# Patient Record
Sex: Male | Born: 1935 | Race: White | Hispanic: No | Smoking: Never smoker
Health system: Southern US, Community
[De-identification: ages and names within clinical notes are randomized; demographics above are authoritative.]

## PROBLEM LIST (undated history)

## (undated) DIAGNOSIS — I4891 Unspecified atrial fibrillation: Secondary | ICD-10-CM

## (undated) DIAGNOSIS — M4856XA Collapsed vertebra, not elsewhere classified, lumbar region, initial encounter for fracture: Secondary | ICD-10-CM

## (undated) DIAGNOSIS — K259 Gastric ulcer, unspecified as acute or chronic, without hemorrhage or perforation: Secondary | ICD-10-CM

## (undated) DIAGNOSIS — C801 Malignant (primary) neoplasm, unspecified: Secondary | ICD-10-CM

## (undated) HISTORY — PX: TONSILLECTOMY: SUR1361

## (undated) HISTORY — PX: HEMORRHOID SURGERY: SHX153

---

## 2020-11-29 ENCOUNTER — Emergency Department: Payer: Medicare Other

## 2020-11-29 ENCOUNTER — Other Ambulatory Visit: Payer: Self-pay

## 2020-11-29 ENCOUNTER — Inpatient Hospital Stay
Admission: EM | Admit: 2020-11-29 | Discharge: 2020-12-25 | DRG: 951 | Disposition: E | Payer: Medicare Other | Attending: Internal Medicine | Admitting: Internal Medicine

## 2020-11-29 ENCOUNTER — Encounter: Payer: Self-pay | Admitting: Emergency Medicine

## 2020-11-29 DIAGNOSIS — C3492 Malignant neoplasm of unspecified part of left bronchus or lung: Secondary | ICD-10-CM

## 2020-11-29 DIAGNOSIS — R112 Nausea with vomiting, unspecified: Secondary | ICD-10-CM | POA: Diagnosis not present

## 2020-11-29 DIAGNOSIS — A419 Sepsis, unspecified organism: Secondary | ICD-10-CM

## 2020-11-29 DIAGNOSIS — Z515 Encounter for palliative care: Principal | ICD-10-CM

## 2020-11-29 DIAGNOSIS — E872 Acidosis: Secondary | ICD-10-CM | POA: Diagnosis present

## 2020-11-29 DIAGNOSIS — C799 Secondary malignant neoplasm of unspecified site: Secondary | ICD-10-CM | POA: Diagnosis not present

## 2020-11-29 DIAGNOSIS — Z66 Do not resuscitate: Secondary | ICD-10-CM | POA: Diagnosis present

## 2020-11-29 DIAGNOSIS — R627 Adult failure to thrive: Secondary | ICD-10-CM | POA: Diagnosis not present

## 2020-11-29 DIAGNOSIS — Z8711 Personal history of peptic ulcer disease: Secondary | ICD-10-CM

## 2020-11-29 DIAGNOSIS — J189 Pneumonia, unspecified organism: Secondary | ICD-10-CM

## 2020-11-29 DIAGNOSIS — R54 Age-related physical debility: Secondary | ICD-10-CM | POA: Diagnosis present

## 2020-11-29 DIAGNOSIS — Z8546 Personal history of malignant neoplasm of prostate: Secondary | ICD-10-CM

## 2020-11-29 DIAGNOSIS — Z7952 Long term (current) use of systemic steroids: Secondary | ICD-10-CM

## 2020-11-29 DIAGNOSIS — C61 Malignant neoplasm of prostate: Secondary | ICD-10-CM | POA: Diagnosis present

## 2020-11-29 DIAGNOSIS — I4891 Unspecified atrial fibrillation: Secondary | ICD-10-CM | POA: Diagnosis present

## 2020-11-29 DIAGNOSIS — N400 Enlarged prostate without lower urinary tract symptoms: Secondary | ICD-10-CM | POA: Diagnosis present

## 2020-11-29 DIAGNOSIS — R778 Other specified abnormalities of plasma proteins: Secondary | ICD-10-CM | POA: Diagnosis present

## 2020-11-29 DIAGNOSIS — A4102 Sepsis due to Methicillin resistant Staphylococcus aureus: Secondary | ICD-10-CM | POA: Diagnosis present

## 2020-11-29 DIAGNOSIS — C3412 Malignant neoplasm of upper lobe, left bronchus or lung: Secondary | ICD-10-CM | POA: Diagnosis present

## 2020-11-29 DIAGNOSIS — Z880 Allergy status to penicillin: Secondary | ICD-10-CM

## 2020-11-29 DIAGNOSIS — Z79899 Other long term (current) drug therapy: Secondary | ICD-10-CM

## 2020-11-29 DIAGNOSIS — I10 Essential (primary) hypertension: Secondary | ICD-10-CM | POA: Diagnosis present

## 2020-11-29 DIAGNOSIS — M84512A Pathological fracture in neoplastic disease, left shoulder, initial encounter for fracture: Secondary | ICD-10-CM | POA: Diagnosis present

## 2020-11-29 DIAGNOSIS — L89616 Pressure-induced deep tissue damage of right heel: Secondary | ICD-10-CM | POA: Diagnosis present

## 2020-11-29 DIAGNOSIS — L8990 Pressure ulcer of unspecified site, unspecified stage: Secondary | ICD-10-CM | POA: Insufficient documentation

## 2020-11-29 DIAGNOSIS — C779 Secondary and unspecified malignant neoplasm of lymph node, unspecified: Secondary | ICD-10-CM | POA: Diagnosis present

## 2020-11-29 DIAGNOSIS — Z791 Long term (current) use of non-steroidal anti-inflammatories (NSAID): Secondary | ICD-10-CM

## 2020-11-29 DIAGNOSIS — Z20822 Contact with and (suspected) exposure to covid-19: Secondary | ICD-10-CM | POA: Diagnosis present

## 2020-11-29 HISTORY — DX: Gastric ulcer, unspecified as acute or chronic, without hemorrhage or perforation: K25.9

## 2020-11-29 HISTORY — DX: Collapsed vertebra, not elsewhere classified, lumbar region, initial encounter for fracture: M48.56XA

## 2020-11-29 HISTORY — DX: Malignant (primary) neoplasm, unspecified: C80.1

## 2020-11-29 HISTORY — DX: Unspecified atrial fibrillation: I48.91

## 2020-11-29 LAB — BASIC METABOLIC PANEL
Anion gap: 13 (ref 5–15)
BUN: 25 mg/dL — ABNORMAL HIGH (ref 8–23)
CO2: 23 mmol/L (ref 22–32)
Calcium: 8.6 mg/dL — ABNORMAL LOW (ref 8.9–10.3)
Chloride: 101 mmol/L (ref 98–111)
Creatinine, Ser: 0.84 mg/dL (ref 0.61–1.24)
GFR, Estimated: >60 mL/min (ref >60)
Glucose, Bld: 127 mg/dL — ABNORMAL HIGH (ref 70–99)
Potassium: 3.5 mmol/L (ref 3.5–5.1)
Sodium: 137 mmol/L (ref 135–145)

## 2020-11-29 LAB — URINALYSIS, COMPLETE (UACMP) WITH MICROSCOPIC
Bacteria, UA: NONE SEEN
Bilirubin Urine: NEGATIVE
Glucose, UA: NEGATIVE mg/dL
Hgb urine dipstick: NEGATIVE
Ketones, ur: NEGATIVE mg/dL
Leukocytes,Ua: NEGATIVE
Nitrite: NEGATIVE
Protein, ur: 30 mg/dL — AB
Specific Gravity, Urine: 1.035 — ABNORMAL HIGH (ref 1.005–1.030)
Squamous Epithelial / HPF: NONE SEEN (ref 0–5)
pH: 5 (ref 5.0–8.0)

## 2020-11-29 LAB — PROTIME-INR
INR: 1.1 (ref 0.8–1.2)
Prothrombin Time: 14 seconds (ref 11.4–15.2)

## 2020-11-29 LAB — CBC
HCT: 41.3 % (ref 39.0–52.0)
Hemoglobin: 14 g/dL (ref 13.0–17.0)
MCH: 31.7 pg (ref 26.0–34.0)
MCHC: 33.9 g/dL (ref 30.0–36.0)
MCV: 93.4 fL (ref 80.0–100.0)
Platelets: 177 10*3/uL (ref 150–400)
RBC: 4.42 MIL/uL (ref 4.22–5.81)
RDW: 14.4 % (ref 11.5–15.5)
WBC: 11.9 10*3/uL — ABNORMAL HIGH (ref 4.0–10.5)
nRBC: 0 % (ref 0.0–0.2)

## 2020-11-29 LAB — HEPATIC FUNCTION PANEL
ALT: 18 U/L (ref 0–44)
AST: 26 U/L (ref 15–41)
Albumin: 3.6 g/dL (ref 3.5–5.0)
Alkaline Phosphatase: 147 U/L — ABNORMAL HIGH (ref 38–126)
Bilirubin, Direct: 0.4 mg/dL — ABNORMAL HIGH (ref 0.0–0.2)
Indirect Bilirubin: 1.3 mg/dL — ABNORMAL HIGH (ref 0.3–0.9)
Total Bilirubin: 1.7 mg/dL — ABNORMAL HIGH (ref 0.3–1.2)
Total Protein: 5.9 g/dL — ABNORMAL LOW (ref 6.5–8.1)

## 2020-11-29 LAB — LACTIC ACID, PLASMA: Lactic Acid, Venous: 2.3 mmol/L (ref 0.5–1.9)

## 2020-11-29 LAB — RESP PANEL BY RT-PCR (FLU A&B, COVID) ARPGX2
Influenza A by PCR: NEGATIVE
Influenza B by PCR: NEGATIVE
SARS Coronavirus 2 by RT PCR: NEGATIVE

## 2020-11-29 LAB — TROPONIN I (HIGH SENSITIVITY)
Troponin I (High Sensitivity): 98 ng/L — ABNORMAL HIGH (ref <18)
Troponin I (High Sensitivity): 83 ng/L — ABNORMAL HIGH (ref <18)

## 2020-11-29 LAB — APTT: aPTT: 35 seconds (ref 24–36)

## 2020-11-29 MED ORDER — SODIUM CHLORIDE 0.9 % IV SOLN: 1.0000 g | Freq: Once | INTRAVENOUS | Status: DC

## 2020-11-29 MED ORDER — ONDANSETRON HCL 4 MG/2ML IJ SOLN: 4.0000 mg | Freq: Four times a day (QID) | INTRAMUSCULAR | Status: DC | PRN

## 2020-11-29 MED ORDER — LACTATED RINGERS IV SOLN: INTRAVENOUS | Status: DC

## 2020-11-29 MED ORDER — SODIUM CHLORIDE 0.9 % IV BOLUS
500.0000 mL | Freq: Once | INTRAVENOUS | Status: AC
  Administered 2020-11-29: 500 mL via INTRAVENOUS

## 2020-11-29 MED ORDER — DIPHENHYDRAMINE HCL 50 MG/ML IJ SOLN: 12.5000 mg | INTRAMUSCULAR | Status: DC | PRN

## 2020-11-29 MED ORDER — ACETAMINOPHEN 325 MG PO TABS
650.0000 mg | ORAL_TABLET | Freq: Four times a day (QID) | ORAL | Status: DC | PRN
  Administered 2020-11-30: 10:00:00 650 mg via ORAL
  Filled 2020-11-29: qty 2

## 2020-11-29 MED ORDER — ONDANSETRON 4 MG PO TBDP
4.0000 mg | ORAL_TABLET | Freq: Four times a day (QID) | ORAL | Status: DC | PRN
  Filled 2020-11-29: qty 1

## 2020-11-29 MED ORDER — HALOPERIDOL LACTATE 5 MG/ML IJ SOLN: 0.5000 mg | INTRAMUSCULAR | Status: DC | PRN

## 2020-11-29 MED ORDER — ACETAMINOPHEN 650 MG RE SUPP: 650.0000 mg | Freq: Four times a day (QID) | RECTAL | Status: DC | PRN

## 2020-11-29 MED ORDER — LOPERAMIDE HCL 2 MG PO CAPS: 2.0000 mg | ORAL_CAPSULE | ORAL | Status: DC | PRN

## 2020-11-29 MED ORDER — MORPHINE SULFATE (CONCENTRATE) 10 MG/0.5ML PO SOLN
5.0000 mg | ORAL | Status: DC | PRN
  Administered 2020-12-01: 11:00:00 5 mg via ORAL
  Filled 2020-11-29: qty 0.5

## 2020-11-29 MED ORDER — HALOPERIDOL 0.5 MG PO TABS
0.5000 mg | ORAL_TABLET | ORAL | Status: DC | PRN
  Filled 2020-11-29: qty 1

## 2020-11-29 MED ORDER — LORAZEPAM 2 MG/ML IJ SOLN
1.0000 mg | INTRAMUSCULAR | Status: DC | PRN
  Administered 2020-12-02 – 2020-12-03 (×5): 1 mg via INTRAVENOUS
  Filled 2020-11-29 (×4): qty 1

## 2020-11-29 MED ORDER — ONDANSETRON HCL 4 MG PO TABS: 4.0000 mg | ORAL_TABLET | Freq: Four times a day (QID) | ORAL | Status: DC | PRN

## 2020-11-29 MED ORDER — ONDANSETRON HCL 4 MG/2ML IJ SOLN
4.0000 mg | Freq: Once | INTRAMUSCULAR | Status: AC
  Administered 2020-11-29: 4 mg via INTRAVENOUS
  Filled 2020-11-29: qty 2

## 2020-11-29 MED ORDER — GLYCOPYRROLATE 0.2 MG/ML IJ SOLN
0.2000 mg | INTRAMUSCULAR | Status: DC | PRN
  Administered 2020-12-02 – 2020-12-03 (×3): 0.2 mg via INTRAVENOUS
  Filled 2020-11-29 (×5): qty 1

## 2020-11-29 MED ORDER — LORAZEPAM 1 MG PO TABS: 1.0000 mg | ORAL_TABLET | ORAL | Status: DC | PRN

## 2020-11-29 MED ORDER — POLYVINYL ALCOHOL 1.4 % OP SOLN
1.0000 [drp] | Freq: Four times a day (QID) | OPHTHALMIC | Status: DC | PRN
  Filled 2020-11-29: qty 15

## 2020-11-29 MED ORDER — METRONIDAZOLE IN NACL 5-0.79 MG/ML-% IV SOLN
500.0000 mg | Freq: Once | INTRAVENOUS | Status: AC
  Administered 2020-11-29: 500 mg via INTRAVENOUS
  Filled 2020-11-29: qty 100

## 2020-11-29 MED ORDER — GLYCOPYRROLATE 0.2 MG/ML IJ SOLN
0.2000 mg | INTRAMUSCULAR | Status: DC | PRN
  Administered 2020-12-02: 13:00:00 0.2 mg via SUBCUTANEOUS
  Filled 2020-11-29: qty 1

## 2020-11-29 MED ORDER — MELOXICAM 7.5 MG PO TABS
15.0000 mg | ORAL_TABLET | Freq: Every day | ORAL | Status: DC
  Administered 2020-11-30 – 2020-12-01 (×2): 15 mg via ORAL
  Filled 2020-11-29 (×5): qty 2

## 2020-11-29 MED ORDER — LORAZEPAM 2 MG/ML PO CONC: 1.0000 mg | ORAL | Status: DC | PRN

## 2020-11-29 MED ORDER — SENNA 8.6 MG PO TABS: 1.0000 | ORAL_TABLET | Freq: Every evening | ORAL | Status: DC | PRN

## 2020-11-29 MED ORDER — SODIUM CHLORIDE 0.9 % IV SOLN
2.0000 g | Freq: Once | INTRAVENOUS | Status: AC
  Administered 2020-11-29: 2 g via INTRAVENOUS
  Filled 2020-11-29: qty 2

## 2020-11-29 MED ORDER — VANCOMYCIN HCL IN DEXTROSE 1-5 GM/200ML-% IV SOLN
1000.0000 mg | Freq: Once | INTRAVENOUS | Status: AC
  Administered 2020-11-29: 1000 mg via INTRAVENOUS
  Filled 2020-11-29: qty 200

## 2020-11-29 MED ORDER — TRAZODONE HCL 50 MG PO TABS: 25.0000 mg | ORAL_TABLET | Freq: Every evening | ORAL | Status: DC | PRN

## 2020-11-29 MED ORDER — MORPHINE SULFATE (CONCENTRATE) 10 MG/0.5ML PO SOLN
5.0000 mg | ORAL | Status: DC | PRN
  Administered 2020-12-02: 5 mg via SUBLINGUAL
  Filled 2020-11-29: qty 0.5

## 2020-11-29 MED ORDER — LORAZEPAM 2 MG/ML IJ SOLN
1.0000 mg | INTRAMUSCULAR | Status: DC | PRN
  Administered 2020-12-02: 08:00:00 1 mg via INTRAVENOUS
  Filled 2020-11-29 (×2): qty 1

## 2020-11-29 MED ORDER — MORPHINE SULFATE (PF) 2 MG/ML IV SOLN
1.0000 mg | INTRAVENOUS | Status: DC | PRN
  Administered 2020-11-30 – 2020-12-03 (×10): 1 mg via INTRAVENOUS
  Filled 2020-11-29 (×10): qty 1

## 2020-11-29 MED ORDER — ACETAMINOPHEN 325 MG PO TABS: 650.0000 mg | ORAL_TABLET | Freq: Four times a day (QID) | ORAL | Status: DC | PRN

## 2020-11-29 MED ORDER — ONDANSETRON HCL 4 MG/2ML IJ SOLN
4.0000 mg | Freq: Four times a day (QID) | INTRAMUSCULAR | Status: DC | PRN
  Administered 2020-11-29 – 2020-12-03 (×6): 4 mg via INTRAVENOUS
  Filled 2020-11-29 (×6): qty 2

## 2020-11-29 MED ORDER — HALOPERIDOL LACTATE 2 MG/ML PO CONC
0.5000 mg | ORAL | Status: DC | PRN
  Filled 2020-11-29: qty 0.3

## 2020-11-29 MED ORDER — IOHEXOL 300 MG/ML  SOLN
100.0000 mL | Freq: Once | INTRAMUSCULAR | Status: AC | PRN
  Administered 2020-11-29: 100 mL via INTRAVENOUS

## 2020-11-29 MED ORDER — GLYCOPYRROLATE 1 MG PO TABS
1.0000 mg | ORAL_TABLET | ORAL | Status: DC | PRN
  Filled 2020-11-29: qty 1

## 2020-11-29 MED ORDER — BIOTENE DRY MOUTH MT LIQD
15.0000 mL | OROMUCOSAL | Status: DC | PRN
  Filled 2020-11-29: qty 15

## 2020-11-29 MED ORDER — ALBUTEROL SULFATE (2.5 MG/3ML) 0.083% IN NEBU: 2.5000 mg | INHALATION_SOLUTION | RESPIRATORY_TRACT | Status: DC | PRN

## 2020-11-29 MED ORDER — DILTIAZEM HCL-DEXTROSE 125-5 MG/125ML-% IV SOLN (PREMIX)
5.0000 mg/h | INTRAVENOUS | Status: DC
  Administered 2020-11-29: 5 mg/h via INTRAVENOUS
  Filled 2020-11-29: qty 125

## 2020-11-29 NOTE — ED Triage Notes (Signed)
Pt comes into the ED via EMS from [facility] with c/o pt altered from baseline, 107/65, 97%RA, 99.1, 73

## 2020-11-29 NOTE — ED Provider Notes (Signed)
[facility] Emergency Department Provider Note    First MD Initiated Contact with Patient 11/25/2020 1203     (approximate)  I have reviewed the triage vital signs and the nursing notes.   HISTORY  Chief Complaint Abdominal Pain    HPI Harry Monroe is a 86 y.o. male with the below listed past medical history presents to the ER from [facility] facility presenting with nausea vomiting decreased p.o. intake significant decline in clinical status over the past week associated with cough and confusion.  No recent antibiotics.  Does feel mildly nauseated this time.  No documented fevers.  No recent antibiotics.  Has been having some intermittent abdominal pain for the past several weeks as well.    Past Medical History:  Diagnosis Date  . Atrial fibrillation ([place])   . Cancer [facility])    Prostate  . Compression fracture of lumbar spine, non-traumatic ([place])   . Gastric ulcer    No family history on file. Past Surgical History:  Procedure Laterality Date  . HEMORRHOID SURGERY    . TONSILLECTOMY     There are no problems to display for this patient.     Prior to Admission medications   Not on File    Allergies Penicillins    Social History Social History   Tobacco Use  . Smoking status: Never Smoker  . Smokeless tobacco: Never Used  Substance Use Topics  . Alcohol use: Never  . Drug use: Not on file    Review of Systems Patient denies headaches, rhinorrhea, blurry vision, numbness, shortness of breath, chest pain, edema, cough, abdominal pain, nausea, vomiting, diarrhea, dysuria, fevers, rashes or hallucinations unless otherwise stated above in HPI. ____________________________________________   PHYSICAL EXAM:  VITAL SIGNS: Vitals:   12/05/2020 1430 12/17/2020 1500  BP: (!) 155/87 140/70  Pulse: (!) 128 (!) 124  Resp: 20 (!) 23  Temp:    SpO2: 97% 97%    Constitutional: Alert and oriented. Ill appearing Eyes: Conjunctivae are  normal.  Head: Atraumatic. Nose: No congestion/rhinnorhea. Mouth/Throat: Mucous membranes are moist.   Neck: No stridor. Painless ROM.  Cardiovascular:  Mildly tachycardic rate, regular rhythm. Grossly normal heart sounds.  Good peripheral circulation. Respiratory: mild tachypnea.  No retractions. Lungs with coarse bibasilar breathsounds Gastrointestinal: Soft and nontender in all four quadrants, no hernia appreciated. No distention. No abdominal bruits. No CVA tenderness. Genitourinary:  Musculoskeletal: No lower extremity tenderness nor edema.  No joint effusions. Neurologic:  Normal speech and language. No gross focal neurologic deficits are appreciated. No facial droop Skin:  Skin is warm, dry and intact. No rash noted. Psychiatric: Mood and affect are normal. Speech and behavior are normal.  ____________________________________________   LABS (all labs ordered are listed, but only abnormal results are displayed)  Results for orders placed or performed during the hospital encounter of 12/16/2020 (from the past 24 hour(s))  Basic metabolic panel     Status: Abnormal   Collection Time: 11/28/2020 11:06 AM  Result Value Ref Range   Sodium 137 135 - 145 mmol/L   Potassium 3.5 3.5 - 5.1 mmol/L   Chloride 101 98 - 111 mmol/L   CO2 23 22 - 32 mmol/L   Glucose, Bld 127 (H) 70 - 99 mg/dL   BUN 25 (H) 8 - 23 mg/dL   Creatinine, Ser 0.84 0.61 - 1.24 mg/dL   Calcium 8.6 (L) 8.9 - 10.3 mg/dL   GFR, Estimated >60 >60 mL/min   Anion gap 13 5 -  15  CBC     Status: Abnormal   Collection Time: 12/20/2020 11:06 AM  Result Value Ref Range   WBC 11.9 (H) 4.0 - 10.5 K/uL   RBC 4.42 4.22 - 5.81 MIL/uL   Hemoglobin 14.0 13.0 - 17.0 g/dL   HCT 41.3 39 - 52 %   MCV 93.4 80.0 - 100.0 fL   MCH 31.7 26.0 - 34.0 pg   MCHC 33.9 30.0 - 36.0 g/dL   RDW 14.4 11.5 - 15.5 %   Platelets 177 150 - 400 K/uL   nRBC 0.0 0.0 - 0.2 %  Troponin I (High Sensitivity)     Status: Abnormal   Collection Time: 12/04/2020  11:06 AM  Result Value Ref Range   Troponin I (High Sensitivity) 98 (H) <18 ng/L  Protime-INR     Status: None   Collection Time: 11/28/2020 11:06 AM  Result Value Ref Range   Prothrombin Time 14.0 11.4 - 15.2 seconds   INR 1.1 0.8 - 1.2  APTT     Status: None   Collection Time: 12/14/2020 11:06 AM  Result Value Ref Range   aPTT 35 24 - 36 seconds  Hepatic function panel     Status: Abnormal   Collection Time: 11/25/2020 11:06 AM  Result Value Ref Range   Total Protein 5.9 (L) 6.5 - 8.1 g/dL   Albumin 3.6 3.5 - 5.0 g/dL   AST 26 15 - 41 U/L   ALT 18 0 - 44 U/L   Alkaline Phosphatase 147 (H) 38 - 126 U/L   Total Bilirubin 1.7 (H) 0.3 - 1.2 mg/dL   Bilirubin, Direct 0.4 (H) 0.0 - 0.2 mg/dL   Indirect Bilirubin 1.3 (H) 0.3 - 0.9 mg/dL  Resp Panel by RT-PCR (Flu A&B, Covid) Nasopharyngeal Swab     Status: None   Collection Time: 12/04/2020 12:12 PM   Specimen: Nasopharyngeal Swab; Nasopharyngeal(NP) swabs in vial transport medium  Result Value Ref Range   SARS Coronavirus 2 by RT PCR NEGATIVE NEGATIVE   Influenza A by PCR NEGATIVE NEGATIVE   Influenza B by PCR NEGATIVE NEGATIVE  Lactic acid, plasma     Status: Abnormal   Collection Time: 11/26/2020 12:13 PM  Result Value Ref Range   Lactic Acid, Venous 2.3 (HH) 0.5 - 1.9 mmol/L  Urinalysis, Complete w Microscopic Urine, Clean Catch     Status: Abnormal   Collection Time: 12/16/2020 12:44 PM  Result Value Ref Range   Color, Urine YELLOW (A) YELLOW   APPearance HAZY (A) CLEAR   Specific Gravity, Urine 1.035 (H) 1.005 - 1.030   pH 5.0 5.0 - 8.0   Glucose, UA NEGATIVE NEGATIVE mg/dL   Hgb urine dipstick NEGATIVE NEGATIVE   Bilirubin Urine NEGATIVE NEGATIVE   Ketones, ur NEGATIVE NEGATIVE mg/dL   Protein, ur 30 (A) NEGATIVE mg/dL   Nitrite NEGATIVE NEGATIVE   Leukocytes,Ua NEGATIVE NEGATIVE   RBC / HPF 0-5 0 - 5 RBC/hpf   WBC, UA 0-5 0 - 5 WBC/hpf   Bacteria, UA NONE SEEN NONE SEEN   Squamous Epithelial / LPF NONE SEEN 0 - 5   Mucus  PRESENT   Troponin I (High Sensitivity)     Status: Abnormal   Collection Time: 12/01/2020  2:43 PM  Result Value Ref Range   Troponin I (High Sensitivity) 83 (H) <18 ng/L   ____________________________________________  EKG My review and personal interpretation at Time: 10:55   Indication: ams  Rate: 115  Rhythm: afib with rvr Axis: normal  Other: normal intervals, nonspecific st abn ____________________________________________  RADIOLOGY  I personally reviewed all radiographic images ordered to evaluate for the above acute complaints and reviewed radiology reports and findings.  These findings were personally discussed with the patient.  Please see medical record for radiology report.  ____________________________________________   PROCEDURES  Procedure(s) performed:  .Critical Care Performed by: Merlyn Lot, MD Authorized by: Merlyn Lot, MD   Critical care provider statement:    Critical care time (minutes):  40   Critical care time was exclusive of:  Separately billable procedures and treating other patients   Critical care was necessary to treat or prevent imminent or life-threatening deterioration of the following conditions:  Sepsis   Critical care was time spent personally by me on the following activities:  Development of treatment plan with patient or surrogate, discussions with consultants, evaluation of patient's response to treatment, examination of patient, obtaining history from patient or surrogate, ordering and performing treatments and interventions, ordering and review of laboratory studies, ordering and review of radiographic studies, pulse oximetry, re-evaluation of patient's condition and review of old charts      Critical Care performed: yes ____________________________________________   INITIAL IMPRESSION / [place] / ED COURSE  Pertinent labs & imaging results that were available during my care of the patient were reviewed by me  and considered in my medical decision making (see chart for details).   DDX: Dehydration, sepsis, pna, uti, hypoglycemia, cva, drug effect, withdrawal, encephalitis   Harry Monroe is a 86 y.o. who presents to the ED with symptoms as described above.  Patient frail appearing tachycardic with a regular rhythm evidence of A. fib with RVR.  Does have low temperature low blood pressure and tachycardia will order work-up for sepsis as he is SIRS positive and coming from nursing facility.  Exam without focal source.  Blood will be sent for the but differential.  Will order IV fluids as well as blood cultures and initiate antibiotics.  Chest x-ray with abnormality concern for possible infiltrates will order CT imaging given his constellation of findings of the chest abdomen and pelvis to further evaluate.  Clinical Course as of Nov 29 1536  Mon Nov 29, 2020  1534 Ultrasound does not show evidence of cholecystitis.  Is not having significant pain there for have a lower suspicion for cholecystitis being the source of the patient's sepsis.  May simply be dehydration.  His troponin is downtrending is not complaining of any chest pain right now.  Suspect elevation is demand ischemia in the setting of 86 year old patient with A. fib with RVR possible sepsis as well.  Is not having any pain right now and it is downtrending I am not going to give heparin.  Will discuss with hospitalist for admission.  Have discussed with the patient and available family all diagnostics and treatments performed thus far and all questions were answered to the best of my ability. The patient demonstrates understanding and agreement with plan.    [PR]    Clinical Course User Index [PR] Merlyn Lot, MD    The patient was evaluated in Emergency Department today for the symptoms described in the history of present illness. He/she was evaluated in the context of the global COVID-19 pandemic, which necessitated consideration  that the patient might be at risk for infection with the SARS-CoV-2 virus that causes COVID-19. Institutional protocols and algorithms that pertain to the evaluation of patients at risk for COVID-19 are in a state of rapid change  based on information released by regulatory bodies including the CDC and federal and state organizations. These policies and algorithms were followed during the patient's care in the ED.  As part of my medical decision making, I reviewed the following data within the [place] notes reviewed and incorporated, Labs reviewed, notes from prior ED visits and [place] Controlled Substance Database   ____________________________________________   FINAL CLINICAL IMPRESSION(S) / ED DIAGNOSES  Final diagnoses:  Sepsis ([place])  Atrial fibrillation with rapid ventricular response ([place])      NEW MEDICATIONS STARTED DURING THIS VISIT:  New Prescriptions   No medications on file     Note:  This document was prepared using Dragon voice recognition software and may include unintentional dictation errors.    Merlyn Lot, MD 12/21/2020 1537

## 2020-11-29 NOTE — Progress Notes (Signed)
   12/11/2020 1700  Clinical Encounter Type  Visited With Patient and family together  Visit Type Initial;Other (Comment)  Referral From Nurse  Consult/Referral To Chaplain  Spiritual Encounters  Spiritual Needs Prayer  Chaplain received OR for patient end of life. Chaplain made pastoral presence known and prayed with daughter and patient.

## 2020-11-29 NOTE — ED Notes (Signed)
Patients daughter states that only visitors should be herself and her sister.

## 2020-11-29 NOTE — ED Triage Notes (Addendum)
Patient to ER via [facility] from [facility] for c/o AMS from baseline. No history of dementia. Patient reports generalized abd pain. Nausea for approx 1-1.5 weeks. Family states patient has been c/o left groin pain, shortness of breath.

## 2020-11-29 NOTE — H&P (Signed)
History and Physical   Harry Monroe GEZ:[id number] DOB: 05-25-1918 DOA: 11/24/2020  PCP: Orvis Brill, [facility]  Outpatient Specialists: [facility] Patient coming from: [facility] assisted living  I have personally briefly reviewed patient's old medical records in [place].  Chief Concern: Failure to thrive  HPI: Harry Monroe is a 86 y.o. 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 intake for approximately 1 to 2 weeks.  Daughter reports that in the last week, patient has required increased assistance with activities of ADLs including getting up to use the bathroom.  Social history: lives at assisted living [facility]. He is retired. Formally worked in [employer].  ED Course: Discussed with ED provider, patient is DNR.    ED vitals were afebrile with elevated respiration rate from 28-29. EKG showed atrial fibrillation with a rate of 115, QTc 423.  Labs were remarkable for BUN of 25, calcium 8.6, alk phos is 147, troponin 98 down trended to 83, lactic acid 2.3, leukocytosis of 11.9.  Review of Systems: As per HPI otherwise 10 point review of systems negative.   Assessment/Plan  Active Problems:   Admission for end of life care   -Patient presenting with apparent sepsis, possibly related to new primary pulmonary neoplasm in addition to metastatic disease given other findings on CT imaging including right proximal humerus suspected pathologic fracture showing mixed lytic and sclerotic lesions, pancreatic lesion, cysts in the bilateral kidneys, left fifth rib sclerosis, 6 rib  subtle sclerosis -After long discussion in the emergency department at [facility], family has decided to proceed with comfort care only -palliative care consulted -Patient is likely to be a candidate for [employer] [facility] or other residential hospice, as she does not appear to be actively dying at this time -However, his reserve is likely quite low given his age and overall frailty -Comfort care order set utilized -Family, daughter, Pleas Koch is adamant about no procedures and/or interventions -No antibiotics or IVF as per family's request -Pain control with morphine as needed, would transition to a drip if needed but patient does not appear to be in pain at this time  Chart reviewed.   DVT prophylaxis: None Code Status: DNR Diet: Regular as tolerated Family Communication: Discussed extensively with daughter, Micael Hampshire at bedside Disposition Plan: hospice facility Consults called: Palliative medicine, discussed with Asencion Gowda Admission status: observation  Past Medical History:  Diagnosis Date  . Atrial fibrillation ([place])   . Cancer [facility])    Prostate  . Compression fracture of lumbar spine, non-traumatic ([place])   . Gastric ulcer    Past Surgical History:  Procedure Laterality Date  . HEMORRHOID SURGERY    . TONSILLECTOMY     Social History:  reports that he has never smoked. He has never used smokeless tobacco. He reports that he does not drink alcohol. No history on file for drug use.  Allergies  Allergen Reactions  . Penicillins Rash   No family history on file. Family history: Family history reviewed and not pertinent  Prior to Admission medications   Medication Sig Start Date End Date Taking? Authorizing Provider  abiraterone acetate (ZYTIGA) 250 MG tablet Take 1,000 mg by mouth  daily. 12/24/2020   [provider]  diclofenac Sodium (VOLTAREN) 1 % GEL Apply 2-4 g topically in the morning, at noon, in the evening, and at bedtime.  10/20/20   [provider]  famotidine (PEPCID) 20 MG tablet Take 20 mg by mouth 2 (two) times daily. 12/04/2020   [provider]  fluticasone (FLONASE) 50 MCG/ACT nasal spray Place 2 sprays into both nostrils daily. 09/30/20   [provider]  furosemide (LASIX) 20 MG tablet Take 20 mg by mouth daily. 12/07/2020   [provider]  meloxicam (MOBIC) 15 MG tablet Take 15 mg by mouth daily. 12/18/2020   [provider]  metoprolol succinate (TOPROL-XL) 25 MG 24 hr tablet Take 12.5 mg by mouth daily. 12/02/2020   [provider]  omeprazole (PRILOSEC) 20 MG capsule Take 20 mg by mouth daily. 12/16/2020   [provider]  potassium chloride SA (KLOR-CON) 20 MEQ tablet Take 20 mEq by mouth 2 (two) times daily. 12/12/2020   [provider]  predniSONE (DELTASONE) 5 MG tablet Take 5 mg by mouth 2 (two) times daily. 12/16/2020   [provider]   Physical Exam: Vitals:   12/23/2020 1400 12/02/2020 1430 12/14/2020 1500 11/28/2020 1530  BP: 131/83 (!) 155/87 140/70 (!) 153/93  Pulse: (!) 58 (!) 128 (!) 124 (!) 114  Resp: (!) 29 20 (!) 23 (!) 21  Temp:      TempSrc:      SpO2: 100% 97% 97% 98%  Weight:      Height:       Constitutional: appears age-appropriate, frail, NAD, calm, comfortable Eyes: PERRL, lids and conjunctivae normal ENMT: Mucous membranes are moist. Posterior pharynx clear of any exudate or lesions. Age-appropriate dentition. Mild hearing loss Neck: normal, supple, no masses, no thyromegaly Respiratory: clear to auscultation bilaterally, no wheezing, no crackles. Normal respiratory effort. No accessory muscle use.  Cardiovascular: Regular rate and rhythm, no murmurs / rubs / gallops. No extremity edema. 2+ pedal pulses. No carotid bruits.  Abdomen: no tenderness, no masses palpated, no hepatosplenomegaly. Bowel sounds positive.  Musculoskeletal: no clubbing / cyanosis. No joint deformity upper and lower extremities. Good ROM, no  contractures, no atrophy. Normal muscle tone.  Skin: Right posterior heel ulceration with necrotic features -present on admission Neurologic: Sensation intact. Strength 5/5 in all 4.  Psychiatric: Normal judgment and insight. Alert and oriented x 3. Normal mood.   EKG: Independently reviewed, showing rate of 115, A. fib, QTc 423.  Chest x-ray on Admission: Personally reviewed and I agree with radiologist reading as below.  DG Chest 2 View  Result Date: 12/13/2020 CLINICAL DATA:  Shortness of breath.  Altered mental status. EXAM: CHEST - 2 VIEW COMPARISON:  None. FINDINGS: Dense irregular pleural calcifications bilaterally, left greater than right. Abnormal irregular sclerosis with some cortical indistinctness in the right proximal humerus, concerning for potential osseous metastatic disease. Otherwise bony demineralization. Atherosclerotic calcification of the aortic arch. Tortuous thoracic aorta. Midthoracic compression fracture is likely chronic. No definite blunting of the costophrenic angles. IMPRESSION: 1. Mixed osseous sclerosis and lucency in the right proximal humerus, highly likely to represent osseous metastatic disease. 2. Extensive pleural calcifications, left greater than right. These could obscure underlying pulmonary nodules. Consider chest CT. 3.  Aortoiliac atherosclerotic vascular disease. 4. Chronic appearing midthoracic compression fracture. Electronically Signed   By: Van Clines M.D.   On: 12/22/2020 12:27   CT CHEST ABDOMEN PELVIS W CONTRAST  Result Date: 12/17/2020 CLINICAL DATA:  Nausea vomiting,  suspected bowel obstruction in a 86 year old male EXAM: CT CHEST, ABDOMEN, AND PELVIS WITH CONTRAST TECHNIQUE: Multidetector CT imaging of the chest, abdomen and pelvis was performed following the standard protocol during bolus administration of intravenous contrast. CONTRAST:  114mL OMNIPAQUE IOHEXOL 300 MG/ML  SOLN COMPARISON:  None FINDINGS: CT CHEST FINDINGS  Cardiovascular: Calcified coronary artery disease. Calcified and noncalcified plaque of the thoracic aorta. No aneurysmal dilation. Central pulmonary vasculature is normal. Limited on venous phase assessment. Mediastinum/Nodes: Esophagus mildly patulous, nonspecific finding. No axillary lymphadenopathy. No mediastinal lymphadenopathy. No hilar lymphadenopathy, borderline enlarged RIGHT hilar lymph node at 1 cm. Lungs/Pleura: Extensive bilateral pleural plaques with calcification in nodularity. Pulmonary nodule with surrounding interstitial thickening in the LEFT upper lobe measuring 12 x 11 mm (image 63, series 4) no consolidation. No pleural effusion. The airways are patent. Musculoskeletal: Mixed lytic and sclerotic lesion likely associated with pathologic fracture of the RIGHT proximal humerus assessment limited due to artifact from scanning but suggested fracture line seen on CT images at the edge of the field of view on the current study. CT ABDOMEN PELVIS FINDINGS Hepatobiliary: No focal, suspicious hepatic lesion. The portal vein is patent. Sludge in the gallbladder outlining biliary calculi. Top-normal size of the common bile duct. Pancreas: Pancreatic lesion with cystic features 1.6 cm. No main duct dilation or sign of inflammation. (Image 58, series 2) Spleen: Normal Adrenals/Urinary Tract: Normal adrenal glands. Cysts in the bilateral kidneys. No hydronephrosis. Urinary bladder is normal. Stomach/Bowel: No acute gastrointestinal process. Appendix is normal. Vascular/Lymphatic: Calcified atheromatous plaque of the abdominal aorta. No aneurysmal dilation. Atheromatous plaque extends into the pelvis into the iliac vessels. No pelvic lymphadenopathy. Retrocaval lymph node at 9 mm in the RIGHT retroperitoneum. Reproductive: Heterogeneous prostate with indistinct margins increased enhancement on the RIGHT as compared to the LEFT. Nonspecific finding on CT. Other: No free air.  No ascites. Musculoskeletal: Signs  of presumed metastatic disease the RIGHT proximal humerus. Also sclerosis of RIGHT glenoid/scapula. Post ORIF of RIGHT proximal femur. Osteopenia. LEFT fifth rib laterally with subtle sclerosis, sixth rib laterally with subtle sclerosis. Anterior third and fourth ribs with sclerosis showing a subtle line, potentially representing subacute fractures of ribs described above. Sternum is intact. Throughout the spine there is multilevel loss of height, in the midthoracic spine at the T7 level there is loss of height that appears chronic due to compression fracture. Also L1 compression fracture is more likely chronic given appearance. L3 with vacuum disc phenomenon at L2-3 with 50% loss of height at L3. Minimal stranding surrounding the vertebral body at this level IMPRESSION: 1. pulmonary nodule with surrounding interstitial thickening in the LEFT upper lobe measuring 12 x 11 mm. This is concerning for primary pulmonary neoplasm in addition to metastatic disease given other findings on the current scan. 2. Signs of presumed metastatic disease the RIGHT proximal humerus with pathologic fracture. 3. Heterogeneous prostate with indistinct margins increased enhancement on the RIGHT as compared to the LEFT. Nonspecific finding on CT. Consider correlation with PSA given sclerotic bony metastatic disease in the RIGHT proximal humerus. 4. Proximal humeral sclerosis with heterogeneity and likely associated with pathologic fracture, partially imaged. Consider dedicated humeral evaluation. 5. Age-indeterminate compression fractures particularly at L3, (sclerosis suggests this may be subacute) correlate with any pain in the these locations as described above. There is approximately 50% loss of height at L3 with mild retropulsion of posterior cortical elements at this level. 6. Extensive pleural plaques with calcification and nodularity, correlate with any history  of asbestos exposure. 7. Top-normal size the common bile duct  approximately 7 mm in the setting of cholelithiasis and abdominal pain, correlate with laboratory values and clinical symptoms to determine whether further evaluation of the biliary tree may be warranted no pericholecystic stranding on the current study. 8. Pancreatic lesion with cystic features 1.6 cm. No aggressive features. Attention on follow-up for assessment with CT or MR in 2 years as clinically warranted. 9. Calcified coronary artery disease. 10. Aortic atherosclerosis. Aortic Atherosclerosis (ICD10-I70.0). Electronically Signed   By: Zetta Bills M.D.   On: 11/27/2020 13:38   US ABDOMEN LIMITED RUQ (LIVER/GB)  Result Date: 12/02/2020 CLINICAL DATA:  Intermittent nausea EXAM: ULTRASOUND ABDOMEN LIMITED RIGHT UPPER QUADRANT COMPARISON:  12/20/2020 CT chest, abdomen and pelvis. FINDINGS: Gallbladder: No wall thickening visualized. Intraluminal calculi measuring up to 5 mm. No sonographic Murphy sign noted by sonographer. Common bile duct: Diameter: 6 mm Liver: No focal lesion identified. Within normal limits in parenchymal echogenicity. Portal vein is patent on color Doppler imaging with normal direction of blood flow towards the liver. Other: None. IMPRESSION: Cholelithiasis without cholecystitis. Electronically Signed   By: Primitivo Gauze M.D.   On: 11/26/2020 15:23   Labs on Admission: I have personally reviewed following labs  CBC: Recent Labs  Lab 12/12/2020 1106  WBC 11.9*  HGB 14.0  HCT 41.3  MCV 93.4  PLT 161   Basic Metabolic Panel: Recent Labs  Lab 12/06/2020 1106  NA 137  K 3.5  CL 101  CO2 23  GLUCOSE 127*  BUN 25*  CREATININE 0.84  CALCIUM 8.6*   GFR: Estimated Creatinine Clearance: 40.7 mL/min (by C-G formula based on SCr of 0.84 mg/dL).  Liver Function Tests: Recent Labs  Lab 12/21/2020 1106  AST 26  ALT 18  ALKPHOS 147*  BILITOT 1.7*  PROT 5.9*  ALBUMIN 3.6   Coagulation Profile: Recent Labs  Lab 12/24/2020 1106  INR 1.1   Urine analysis:     Component Value Date/Time   COLORURINE YELLOW (A) 12/16/2020 1244   APPEARANCEUR HAZY (A) 12/02/2020 1244   LABSPEC 1.035 (H) 12/17/2020 1244   PHURINE 5.0 11/28/2020 [place] 12/20/2020 1244   HGBUR NEGATIVE 12/08/2020 1244   [place] NEGATIVE 11/25/2020 1244   [place] 12/08/2020 1244   PROTEINUR 30 (A) 12/13/2020 1244   NITRITE NEGATIVE 12/11/2020 1244   LEUKOCYTESUR NEGATIVE 12/02/2020 1244   Norvell Caswell N Adamaris King D.O. [facility]  If 12AM-7AM, please contact overnight-coverage provider If 7AM-7PM, please contact day coverage provider [email or web address]  11/27/2020, 4:50 PM

## 2020-11-29 NOTE — Progress Notes (Signed)
PHARMACY -  BRIEF ANTIBIOTIC NOTE   Pharmacy has received consult(s) for Vancomycin and Cefepime from an ED provider.  The patient's profile has been reviewed for ht/wt/allergies/indication/available labs.    One time order(s) placed for Cefepime 2g and Vancomycin 1g by ED provider.  Further antibiotics/pharmacy consults should be ordered by admitting physician if indicated.                       Thank you, Vira Blanco 12/14/2020  2:05 PM

## 2020-11-30 DIAGNOSIS — Z66 Do not resuscitate: Secondary | ICD-10-CM | POA: Diagnosis present

## 2020-11-30 DIAGNOSIS — A419 Sepsis, unspecified organism: Secondary | ICD-10-CM | POA: Diagnosis not present

## 2020-11-30 DIAGNOSIS — Z8546 Personal history of malignant neoplasm of prostate: Secondary | ICD-10-CM | POA: Diagnosis not present

## 2020-11-30 DIAGNOSIS — R627 Adult failure to thrive: Secondary | ICD-10-CM | POA: Diagnosis present

## 2020-11-30 DIAGNOSIS — I4891 Unspecified atrial fibrillation: Secondary | ICD-10-CM | POA: Diagnosis present

## 2020-11-30 DIAGNOSIS — Z7952 Long term (current) use of systemic steroids: Secondary | ICD-10-CM | POA: Diagnosis not present

## 2020-11-30 DIAGNOSIS — L89622 Pressure ulcer of left heel, stage 2: Secondary | ICD-10-CM | POA: Diagnosis not present

## 2020-11-30 DIAGNOSIS — C61 Malignant neoplasm of prostate: Secondary | ICD-10-CM | POA: Diagnosis present

## 2020-11-30 DIAGNOSIS — Z8711 Personal history of peptic ulcer disease: Secondary | ICD-10-CM | POA: Diagnosis not present

## 2020-11-30 DIAGNOSIS — C3490 Malignant neoplasm of unspecified part of unspecified bronchus or lung: Secondary | ICD-10-CM | POA: Diagnosis not present

## 2020-11-30 DIAGNOSIS — N400 Enlarged prostate without lower urinary tract symptoms: Secondary | ICD-10-CM | POA: Diagnosis present

## 2020-11-30 DIAGNOSIS — Z515 Encounter for palliative care: Secondary | ICD-10-CM | POA: Diagnosis not present

## 2020-11-30 DIAGNOSIS — Z20822 Contact with and (suspected) exposure to covid-19: Secondary | ICD-10-CM | POA: Diagnosis present

## 2020-11-30 DIAGNOSIS — M84512A Pathological fracture in neoplastic disease, left shoulder, initial encounter for fracture: Secondary | ICD-10-CM | POA: Diagnosis present

## 2020-11-30 DIAGNOSIS — L89616 Pressure-induced deep tissue damage of right heel: Secondary | ICD-10-CM | POA: Diagnosis present

## 2020-11-30 DIAGNOSIS — C779 Secondary and unspecified malignant neoplasm of lymph node, unspecified: Secondary | ICD-10-CM | POA: Diagnosis present

## 2020-11-30 DIAGNOSIS — A4102 Sepsis due to Methicillin resistant Staphylococcus aureus: Secondary | ICD-10-CM | POA: Diagnosis present

## 2020-11-30 DIAGNOSIS — J189 Pneumonia, unspecified organism: Secondary | ICD-10-CM | POA: Diagnosis not present

## 2020-11-30 DIAGNOSIS — R112 Nausea with vomiting, unspecified: Secondary | ICD-10-CM | POA: Diagnosis present

## 2020-11-30 DIAGNOSIS — R778 Other specified abnormalities of plasma proteins: Secondary | ICD-10-CM | POA: Diagnosis present

## 2020-11-30 DIAGNOSIS — Z791 Long term (current) use of non-steroidal anti-inflammatories (NSAID): Secondary | ICD-10-CM | POA: Diagnosis not present

## 2020-11-30 DIAGNOSIS — C3412 Malignant neoplasm of upper lobe, left bronchus or lung: Secondary | ICD-10-CM | POA: Diagnosis present

## 2020-11-30 DIAGNOSIS — R54 Age-related physical debility: Secondary | ICD-10-CM | POA: Diagnosis present

## 2020-11-30 DIAGNOSIS — I10 Essential (primary) hypertension: Secondary | ICD-10-CM | POA: Diagnosis present

## 2020-11-30 DIAGNOSIS — E872 Acidosis: Secondary | ICD-10-CM | POA: Diagnosis present

## 2020-11-30 DIAGNOSIS — Z79899 Other long term (current) drug therapy: Secondary | ICD-10-CM | POA: Diagnosis not present

## 2020-11-30 DIAGNOSIS — Z880 Allergy status to penicillin: Secondary | ICD-10-CM | POA: Diagnosis not present

## 2020-11-30 DIAGNOSIS — M84422S Pathological fracture, left humerus, sequela: Secondary | ICD-10-CM | POA: Diagnosis not present

## 2020-11-30 LAB — BLOOD CULTURE ID PANEL (REFLEXED) - BCID2

## 2020-11-30 NOTE — Progress Notes (Addendum)
PROGRESS NOTE    Harry [facility]  XTG:[id number] DOB: 18-Oct-1918 DOA: 12/20/2020 PCP: Orvis Brill, [facility]  Brief Narrative:  HPI per Dr. Rupert [facility] on 12/12/2020 Harry Monroe is a 86 y.o. 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 intake for approximately 1 to 2 weeks.  Daughter reports that in the last week, patient has required increased assistance with activities of ADLs including getting up to use the bathroom.  Social history: lives at assisted living [place]. He is retired. Formally worked in [employer].  ED Course: Discussed with ED provider, patient is DNR.    ED vitals were afebrile with elevated respiration rate from 28-29. EKG showed atrial fibrillation with a rate of 115, QTc 423.  Labs were remarkable for BUN of 25, calcium 8.6, alk phos is 147, troponin 98 down trended to 83, lactic acid 2.3, leukocytosis of 11.9.  **Interim History  Patient was transitioned to comfort care yesterday and is currently resting.  Will need placement for residential hospice and palliative care has been consulted for goals of care discussion.  Assessment & Plan:   Active Problems:   Admission for end of life care  End of Life Care/Terminal Care Sepsis secondary to ? Bacteremia, poA  Leukocytosis Mildly elevated troponin Lactic Acidosis Hyperbilirubinemia History of atrial fibrillation New Primary Pulmonary Neoplasm Malignant neoplasm of the prostate with metastasis to the lymph nodes Pathologic Fractures Hypertension Adult Failure to Thrive Hx of Gastric Ulcer GOC: DNR, poA   Plan: -Patient presenting with  apparent sepsis, possibly related to new primary pulmonary neoplasm in addition to metastatic disease given other findings on CT imaging including right proximal humerus suspected pathologic fracture showing mixed lytic and sclerotic lesions, pancreatic lesion, cysts in the bilateral kidneys, left fifth rib sclerosis, 6 rib subtle sclerosis -After long discussion in the emergency department at [facility], family has decided to proceed with comfort care only -Palliative care consulted for further [place] -Patient is likely to be a candidate for [employer] or other residential hospice, as she does not appear to be actively dying at this time -However, his reserve is likely quite low given his age and overall frailty -Comfort care order set utilized; continue albuterol nebs every 2 as needed for wheezing, acetaminophen 650 mg p.o./RC for mild pain or fever greater than 101, IV Benadryl 12.5 mg every 4 hours as needed for itching, glycopyrrolate p.o/SQ/IV for excessive secretions, haloperidol for agitation or delirium, loperamide 2 mg every 2 as needed for any diarrhea, lorazepam 1 mg every 4 hours as needed seizure or anxiety, Mobic, morphine concentrate p.o. or sublingual 5 mg p.o. every 2 as needed for moderate as well as ondansetron, as well as senna cot p.o. nightly for constipation and trazodone for sleep and Liquifilm tears 1 drop both eyes 4 times daily as needed -Dr. Tobie [facility] discussed with Family, daughter, Harry Monroe and she is adamant about no procedures and/or interventions -No antibiotics or IVF as per family's request -Pain control with morphine as needed, would transition to a drip if needed but patient does not appear to be in pain at this time   DVT prophylaxis: None, Patient is Comfort Care Code Status: DO NOT RESUSCITATE  Family Communication: No family present at bedside  Disposition Plan: Anticipating discharging to Residential Hospice when bed is available    Status is: Observation  The patient will require care spanning > 2 midnights and should be moved to inpatient because: Unsafe d/c plan, IV treatments appropriate due to intensity of illness or inability to take PO and Inpatient level of care appropriate due to severity of illness  Dispo: The patient is from: Home              Anticipated d/c is to: Residential Hospice              Anticipated d/c date is:1-2 days              Patient currently is medically stable to d/c.  Consultants:   Palliative Care Medicine    Procedures: None  Antimicrobials:  Anti-infectives (From admission, onward)   Start     Dose/Rate Route Frequency Ordered Stop   12/02/2020 1400  ceFEPIme (MAXIPIME) 2 g in sodium chloride 0.9 % 100 mL IVPB        2 g 200 mL/hr over 30 Minutes Intravenous  Once 12/07/2020 1352 12/15/2020 1510   11/28/2020 1400  metroNIDAZOLE (FLAGYL) IVPB 500 mg        500 mg 100 mL/hr over 60 Minutes Intravenous  Once 12/18/2020 1352 12/16/2020 1637   12/12/2020 1400  vancomycin (VANCOCIN) IVPB 1000 mg/200 mL premix        1,000 mg 200 mL/hr over 60 Minutes Intravenous  Once 12/10/2020 1352 12/06/2020 1702   12/23/2020 1345  cefTRIAXone (ROCEPHIN) 1 g in sodium chloride 0.9 % 100 mL IVPB  Status:  Discontinued        1 g 200 mL/hr over 30 Minutes Intravenous  Once 12/07/2020 1331 12/16/2020 1352        Subjective: Seen and examined at bedside he is resting and states that he slept well.  Tolerating his food and states he did have some mild abdominal pain.  No chest pain, lightheadedness or dizziness.  Resting comfortably and denies any other complaints or concerns at this time.  Objective: Vitals:   11/30/20 0358 11/30/20 0725 11/30/20 0730 11/30/20 0820  BP: (!) 163/85 130/62  125/83  Pulse: (!) 118 89 70 65  Resp: (!) 25 (!) $Remo'30 12 16  'uvrtb$ Temp:    97.9 F (36.6 C)  TempSrc:    Oral  SpO2: 95% 98% 93% 100%  Weight:      Height:        Intake/Output Summary (Last 24 hours) at 11/30/2020  1139 Last data filed at 11/30/2020 1000 Gross per 24 hour  Intake 1482.6 ml  Output 200 ml  Net 1282.6 ml   Filed Weights   11/27/2020 1054  Weight: 68 kg   Examination: Physical Exam:  Constitutional: WN/WD elderly Caucasian male currently in NAD and appears calm and comfortable Eyes: Lids and conjunctivae normal, sclerae anicteric  ENMT: External Ears, Nose appear normal. Grossly normal hearing..  Neck: Appears normal, supple, no cervical masses, normal ROM, no appreciable thyromegaly; no JVD Respiratory: Diminished to auscultation bilaterally, no wheezing, rales, rhonchi or crackles. Normal respiratory effort and patient is not tachypenic. No accessory muscle use. Unlabored breathing  Cardiovascular: RRR, no murmurs / rubs / gallops. S1 and S2 auscultated. No extremity edema  Abdomen: Soft, mildly tender, slightly distended. Bowel sounds positive.  GU: Deferred. Musculoskeletal: No clubbing / cyanosis of digits/nails. No joint deformity upper and lower extremities.  Skin: Has some  bruising and ecchymosis in the lower extremities on limited skin evaluation. No induration; Warm and dry.  Neurologic: CN 2-12 grossly intact with no focal deficits. Romberg sign cerebellar reflexes not assessed.  Psychiatric: Normal judgment and insight. Alert and oriented x 3. Normal mood and appropriate affect.   Data Reviewed: I have personally reviewed following labs and imaging studies  CBC: Recent Labs  Lab 11/24/2020 1106  WBC 11.9*  HGB 14.0  HCT 41.3  MCV 93.4  PLT 784   Basic Metabolic Panel: Recent Labs  Lab 12/02/2020 1106  NA 137  K 3.5  CL 101  CO2 23  GLUCOSE 127*  BUN 25*  CREATININE 0.84  CALCIUM 8.6*   GFR: Estimated Creatinine Clearance: 40.7 mL/min (by C-G formula based on SCr of 0.84 mg/dL). Liver Function Tests: Recent Labs  Lab 12/06/2020 1106  AST 26  ALT 18  ALKPHOS 147*  BILITOT 1.7*  PROT 5.9*  ALBUMIN 3.6   No results for input(s): LIPASE, AMYLASE in  the last 168 hours. No results for input(s): AMMONIA in the last 168 hours. Coagulation Profile: Recent Labs  Lab 12/21/2020 1106  INR 1.1   Cardiac Enzymes: No results for input(s): CKTOTAL, CKMB, CKMBINDEX, TROPONINI in the last 168 hours. BNP (last 3 results) No results for input(s): PROBNP in the last 8760 hours. HbA1C: No results for input(s): HGBA1C in the last 72 hours. CBG: No results for input(s): GLUCAP in the last 168 hours. Lipid Profile: No results for input(s): CHOL, HDL, LDLCALC, TRIG, CHOLHDL, LDLDIRECT in the last 72 hours. Thyroid Function Tests: No results for input(s): TSH, T4TOTAL, FREET4, T3FREE, THYROIDAB in the last 72 hours. Anemia Panel: No results for input(s): VITAMINB12, FOLATE, FERRITIN, TIBC, IRON, RETICCTPCT in the last 72 hours. Sepsis Labs: Recent Labs  Lab 12/12/2020 1213  LATICACIDVEN 2.3*    Recent Results (from the past 240 hour(s))  Resp Panel by RT-PCR (Flu A&B, Covid) Nasopharyngeal Swab     Status: None   Collection Time: 12/17/2020 12:12 PM   Specimen: Nasopharyngeal Swab; Nasopharyngeal(NP) swabs in vial transport medium  Result Value Ref Range Status   SARS Coronavirus 2 by RT PCR NEGATIVE NEGATIVE Final    Comment: (NOTE) SARS-CoV-2 target nucleic acids are NOT DETECTED.  The SARS-CoV-2 RNA is generally detectable in upper respiratory specimens during the acute phase of infection. The lowest concentration of SARS-CoV-2 viral copies this assay can detect is 138 copies/mL. A negative result does not preclude SARS-Cov-2 infection and should not be used as the sole basis for treatment or other patient management decisions. A negative result may occur with  improper specimen collection/handling, submission of specimen other than nasopharyngeal swab, presence of viral mutation(s) within the areas targeted by this assay, and inadequate number of viral copies(<138 copies/mL). A negative result must be combined with clinical observations,  patient history, and epidemiological information. The expected result is Negative.  Fact Sheet for Patients:  EntrepreneurPulse.com.au  Fact Sheet for Healthcare Providers:  IncredibleEmployment.be  This test is no t yet approved or cleared by the [place] FDA and  has been authorized for detection and/or diagnosis of SARS-CoV-2 by FDA under an Emergency Use Authorization (EUA). This EUA will remain  in effect (meaning this test can be used) for the duration of the COVID-19 declaration under Section 564(b)(1) of the Act, 21 U.S.C.section 360bbb-3(b)(1), unless the authorization is terminated  or revoked sooner.       Influenza A by PCR NEGATIVE NEGATIVE Final   Influenza B  by PCR NEGATIVE NEGATIVE Final    Comment: (NOTE) The Xpert Xpress SARS-CoV-2/FLU/RSV plus assay is intended as an aid in the diagnosis of influenza from Nasopharyngeal swab specimens and should not be used as a sole basis for treatment. Nasal washings and aspirates are unacceptable for Xpert Xpress SARS-CoV-2/FLU/RSV testing.  Fact Sheet for Patients: EntrepreneurPulse.com.au  Fact Sheet for Healthcare Providers: IncredibleEmployment.be  This test is not yet approved or cleared by the [place] FDA and has been authorized for detection and/or diagnosis of SARS-CoV-2 by FDA under an Emergency Use Authorization (EUA). This EUA will remain in effect (meaning this test can be used) for the duration of the COVID-19 declaration under Section 564(b)(1) of the Act, 21 U.S.C. section 360bbb-3(b)(1), unless the authorization is terminated or revoked.  Performed at [facility], [address] [place], [place] [address]   Blood culture (routine single)     Status: None (Preliminary result)   Collection Time: 11/27/2020 12:13 PM   Specimen: BLOOD  Result Value Ref Range Status   Specimen Description BLOOD BLOOD LEFT FOREARM   Final   Special Requests   Final    BOTTLES DRAWN AEROBIC AND ANAEROBIC Blood Culture adequate volume   Culture  Setup Time   Final    GRAM POSITIVE COCCI IN BOTH AEROBIC AND ANAEROBIC BOTTLES Organism ID to follow CRITICAL RESULT CALLED TO, READ BACK BY AND VERIFIED WITH: SCOTT HALL$RemoveBeforeD'@0505'[id number]$  11/30/20 RH Performed at [facility], [address], [place], [place] [address]    Culture GRAM POSITIVE COCCI  Final   Report Status PENDING  Incomplete  Blood Culture ID Panel (Reflexed)     Status: Abnormal   Collection Time: 12/21/2020 12:13 PM  Result Value Ref Range Status   Enterococcus faecalis NOT DETECTED NOT DETECTED Final   Enterococcus Faecium NOT DETECTED NOT DETECTED Final   Listeria monocytogenes NOT DETECTED NOT DETECTED Final   Staphylococcus species DETECTED (A) NOT DETECTED Final    Comment: CRITICAL RESULT CALLED TO, READ BACK BY AND VERIFIED WITH: SCOTT HALL $RemoveBe'@0505'[id number]$  11/30/20 RH    Staphylococcus aureus (BCID) DETECTED (A) NOT DETECTED Final    Comment: Methicillin (oxacillin)-resistant Staphylococcus aureus (MRSA). MRSA is predictably resistant to beta-lactam antibiotics (except ceftaroline). Preferred therapy is vancomycin unless clinically contraindicated. Patient requires contact precautions if  hospitalized. CRITICAL RESULT CALLED TO, READ BACK BY AND VERIFIED WITH: SCOTT HALL $RemoveBe'@0505'[id number]$  11/30/20 RH    Staphylococcus epidermidis NOT DETECTED NOT DETECTED Final   Staphylococcus lugdunensis NOT DETECTED NOT DETECTED Final   Streptococcus species NOT DETECTED NOT DETECTED Final   Streptococcus agalactiae NOT DETECTED NOT DETECTED Final   Streptococcus pneumoniae NOT DETECTED NOT DETECTED Final   Streptococcus pyogenes NOT DETECTED NOT DETECTED Final   A.calcoaceticus-baumannii NOT DETECTED NOT DETECTED Final   Bacteroides fragilis NOT DETECTED NOT DETECTED Final   Enterobacterales NOT DETECTED NOT DETECTED Final   Enterobacter cloacae complex NOT DETECTED NOT DETECTED  Final   Escherichia coli NOT DETECTED NOT DETECTED Final   Klebsiella aerogenes NOT DETECTED NOT DETECTED Final   Klebsiella oxytoca NOT DETECTED NOT DETECTED Final   Klebsiella pneumoniae NOT DETECTED NOT DETECTED Final   Proteus species NOT DETECTED NOT DETECTED Final   Salmonella species NOT DETECTED NOT DETECTED Final   Serratia marcescens NOT DETECTED NOT DETECTED Final   Haemophilus influenzae NOT DETECTED NOT DETECTED Final   Neisseria meningitidis NOT DETECTED NOT DETECTED Final   Pseudomonas aeruginosa NOT DETECTED NOT DETECTED Final   Stenotrophomonas maltophilia NOT DETECTED NOT  DETECTED Final   Candida albicans NOT DETECTED NOT DETECTED Final   Candida auris NOT DETECTED NOT DETECTED Final   Candida glabrata NOT DETECTED NOT DETECTED Final   Candida krusei NOT DETECTED NOT DETECTED Final   Candida parapsilosis NOT DETECTED NOT DETECTED Final   Candida tropicalis NOT DETECTED NOT DETECTED Final   Cryptococcus neoformans/gattii NOT DETECTED NOT DETECTED Final   Meth resistant mecA/C and MREJ DETECTED (A) NOT DETECTED Final    Comment: CRITICAL RESULT CALLED TO, READ BACK BY AND VERIFIED WITH: SCOTT HALL $RemoveBe'@0505'[id number]$  11/30/20 RH Performed at [facility], [address] [place], [place] [address]      RN Pressure Injury Documentation:     Estimated body mass index is 24.96 kg/m as calculated from the following:   Height as of this encounter: $RemoveBeforeD'5\' 5"'[id number]$  (1.651 m).   Weight as of this encounter: 68 kg.  Malnutrition Type:   Malnutrition Characteristics:   Nutrition Interventions:   Radiology Studies: DG Chest 2 View  Result Date: 11/26/2020 CLINICAL DATA:  Shortness of breath.  Altered mental status. EXAM: CHEST - 2 VIEW COMPARISON:  None. FINDINGS: Dense irregular pleural calcifications bilaterally, left greater than right. Abnormal irregular sclerosis with some cortical indistinctness in the right proximal humerus, concerning for potential osseous metastatic disease.  Otherwise bony demineralization. Atherosclerotic calcification of the aortic arch. Tortuous thoracic aorta. Midthoracic compression fracture is likely chronic. No definite blunting of the costophrenic angles. IMPRESSION: 1. Mixed osseous sclerosis and lucency in the right proximal humerus, highly likely to represent osseous metastatic disease. 2. Extensive pleural calcifications, left greater than right. These could obscure underlying pulmonary nodules. Consider chest CT. 3.  Aortoiliac atherosclerotic vascular disease. 4. Chronic appearing midthoracic compression fracture. Electronically Signed   By: Van Clines M.D.   On: 12/24/2020 12:27   CT CHEST ABDOMEN PELVIS W CONTRAST  Result Date: 12/12/2020 CLINICAL DATA:  Nausea vomiting, suspected bowel obstruction in a 86 year old male EXAM: CT CHEST, ABDOMEN, AND PELVIS WITH CONTRAST TECHNIQUE: Multidetector CT imaging of the chest, abdomen and pelvis was performed following the standard protocol during bolus administration of intravenous contrast. CONTRAST:  139mL OMNIPAQUE IOHEXOL 300 MG/ML  SOLN COMPARISON:  None FINDINGS: CT CHEST FINDINGS Cardiovascular: Calcified coronary artery disease. Calcified and noncalcified plaque of the thoracic aorta. No aneurysmal dilation. Central pulmonary vasculature is normal. Limited on venous phase assessment. Mediastinum/Nodes: Esophagus mildly patulous, nonspecific finding. No axillary lymphadenopathy. No mediastinal lymphadenopathy. No hilar lymphadenopathy, borderline enlarged RIGHT hilar lymph node at 1 cm. Lungs/Pleura: Extensive bilateral pleural plaques with calcification in nodularity. Pulmonary nodule with surrounding interstitial thickening in the LEFT upper lobe measuring 12 x 11 mm (image 63, series 4) no consolidation. No pleural effusion. The airways are patent. Musculoskeletal: Mixed lytic and sclerotic lesion likely associated with pathologic fracture of the RIGHT proximal humerus assessment limited  due to artifact from scanning but suggested fracture line seen on CT images at the edge of the field of view on the current study. CT ABDOMEN PELVIS FINDINGS Hepatobiliary: No focal, suspicious hepatic lesion. The portal vein is patent. Sludge in the gallbladder outlining biliary calculi. Top-normal size of the common bile duct. Pancreas: Pancreatic lesion with cystic features 1.6 cm. No main duct dilation or sign of inflammation. (Image 58, series 2) Spleen: Normal Adrenals/Urinary Tract: Normal adrenal glands. Cysts in the bilateral kidneys. No hydronephrosis. Urinary bladder is normal. Stomach/Bowel: No acute gastrointestinal process. Appendix is normal. Vascular/Lymphatic: Calcified atheromatous plaque of the abdominal aorta. No aneurysmal dilation.  Atheromatous plaque extends into the pelvis into the iliac vessels. No pelvic lymphadenopathy. Retrocaval lymph node at 9 mm in the RIGHT retroperitoneum. Reproductive: Heterogeneous prostate with indistinct margins increased enhancement on the RIGHT as compared to the LEFT. Nonspecific finding on CT. Other: No free air.  No ascites. Musculoskeletal: Signs of presumed metastatic disease the RIGHT proximal humerus. Also sclerosis of RIGHT glenoid/scapula. Post ORIF of RIGHT proximal femur. Osteopenia. LEFT fifth rib laterally with subtle sclerosis, sixth rib laterally with subtle sclerosis. Anterior third and fourth ribs with sclerosis showing a subtle line, potentially representing subacute fractures of ribs described above. Sternum is intact. Throughout the spine there is multilevel loss of height, in the midthoracic spine at the T7 level there is loss of height that appears chronic due to compression fracture. Also L1 compression fracture is more likely chronic given appearance. L3 with vacuum disc phenomenon at L2-3 with 50% loss of height at L3. Minimal stranding surrounding the vertebral body at this level IMPRESSION: 1. pulmonary nodule with surrounding  interstitial thickening in the LEFT upper lobe measuring 12 x 11 mm. This is concerning for primary pulmonary neoplasm in addition to metastatic disease given other findings on the current scan. 2. Signs of presumed metastatic disease the RIGHT proximal humerus with pathologic fracture. 3. Heterogeneous prostate with indistinct margins increased enhancement on the RIGHT as compared to the LEFT. Nonspecific finding on CT. Consider correlation with PSA given sclerotic bony metastatic disease in the RIGHT proximal humerus. 4. Proximal humeral sclerosis with heterogeneity and likely associated with pathologic fracture, partially imaged. Consider dedicated humeral evaluation. 5. Age-indeterminate compression fractures particularly at L3, (sclerosis suggests this may be subacute) correlate with any pain in the these locations as described above. There is approximately 50% loss of height at L3 with mild retropulsion of posterior cortical elements at this level. 6. Extensive pleural plaques with calcification and nodularity, correlate with any history of asbestos exposure. 7. Top-normal size the common bile duct approximately 7 mm in the setting of cholelithiasis and abdominal pain, correlate with laboratory values and clinical symptoms to determine whether further evaluation of the biliary tree may be warranted no pericholecystic stranding on the current study. 8. Pancreatic lesion with cystic features 1.6 cm. No aggressive features. Attention on follow-up for assessment with CT or MR in 2 years as clinically warranted. 9. Calcified coronary artery disease. 10. Aortic atherosclerosis. Aortic Atherosclerosis (ICD10-I70.0). Electronically Signed   By: Zetta Bills M.D.   On: 12/21/2020 13:38   US ABDOMEN LIMITED RUQ (LIVER/GB)  Result Date: 12/01/2020 CLINICAL DATA:  Intermittent nausea EXAM: ULTRASOUND ABDOMEN LIMITED RIGHT UPPER QUADRANT COMPARISON:  12/20/2020 CT chest, abdomen and pelvis. FINDINGS: Gallbladder: No  wall thickening visualized. Intraluminal calculi measuring up to 5 mm. No sonographic Murphy sign noted by sonographer. Common bile duct: Diameter: 6 mm Liver: No focal lesion identified. Within normal limits in parenchymal echogenicity. Portal vein is patent on color Doppler imaging with normal direction of blood flow towards the liver. Other: None. IMPRESSION: Cholelithiasis without cholecystitis. Electronically Signed   By: Primitivo Gauze M.D.   On: 12/21/2020 15:23   Scheduled Meds: . meloxicam  15 mg Oral Daily   Continuous Infusions:   LOS: 0 days   Kerney Elbe, DO [facility] PAGER is on AMION  If 7PM-7AM, please contact night-coverage [email or web address]

## 2020-11-30 NOTE — TOC Initial Note (Signed)
Transition of Care [facility]) - Initial/Assessment Note    Patient Details  Name: Harry Monroe MRN: [id number] Date of Birth: 23-Nov-1918  Transition of Care [facility]) CM/SW Contact:    Shelbie Hutching, RN Phone Number: 11/30/2020, 1:10 PM  Clinical Narrative:                 Patient placed under observation for comfort measures.  Patient is from [facility].  RNCM met with patient and patient's daughter at the bedside.  Daughter and patient both express that they just want him to be comfortable.  Family agrees on residential hospice and chooses [facility] in [place].  Residential hospice referral given to [facility] with [facility].    Expected Discharge Plan: [place] Barriers to Discharge: Hospice Bed not available   Patient Goals and CMS Choice Patient states their goals for this hospitalization and ongoing recovery are:: Patient and family just want him to be comfortable CMS Medicare.gov Compare Post Acute Care list provided to:: Patient Represenative (must comment) Choice offered to / list presented to : [place] / Guardian  Expected Discharge Plan and Services Expected Discharge Plan: [place] In-house Referral: Hospice / Palliative Care Discharge Planning Services: CM Consult Post Acute Care Choice: Hospice Living arrangements for the past 2 months: [place]                 DME Arranged: N/A DME Agency: NA       HH Arranged: NA          Prior Living Arrangements/Services Living arrangements for the past 2 months: [place] Lives with:: Facility Resident Patient language and need for interpreter reviewed:: Yes        Need for Family Participation in Patient Care: Yes (Comment) (comfort care) Care giver support system in place?: Yes (comment) (daughter)   Criminal Activity/Legal Involvement Pertinent to Current Situation/Hospitalization: No - Comment as needed  Activities of Daily Living       Permission Sought/Granted Permission sought to share information with : Case Manager, Family Supports, Chartered certified accountant granted to share information with : Yes, Verbal Permission Granted  Share Information with NAME: Pleas Koch  Permission granted to share info w AGENCY: [place] granted to share info w Relationship: daughter     Emotional Assessment Appearance:: Appears stated age Attitude/Demeanor/Rapport: Engaged Affect (typically observed): Accepting Orientation: : Oriented to Self, Oriented to Place, Oriented to Situation Alcohol / Substance Use: Not Applicable Psych Involvement: No (comment)  Admission diagnosis:  Pneumonia [J18.9] Admission for end of life care [Z51.5] Atrial fibrillation with rapid ventricular response ([place]) [I48.91] Sepsis ([place]) [A41.9] Patient Active Problem List   Diagnosis Date Noted  . Admission for end of life care 11/28/2020   PCP:  [facility] Pharmacy:  No Pharmacies Listed    Social Determinants of Health (SDOH) Interventions    Readmission Risk Interventions No flowsheet data found.

## 2020-11-30 NOTE — Progress Notes (Addendum)
[place] Room [facility]) Hospital Liaison RN note:  Received request from Dr. Alfredia Ferguson and Doran Clay, [facility] for family interest in [place]. Chart is under review and eligibility has been approved. Spoke with daughter, Pleas Koch, in the room to confirm interest, initiate education related to hospice philosophy and explain services. Pleas Koch verbalized understanding and all questions were answered.  Unfortunately, Hospice Home does not have a bed to offer today. Daughter and hospital care team are aware. [place] Liaison will continue to follow for bed availability.  Please call with any hospice related questions or concerns.  Thank you for the opportunity to participate in this patient's care.  Zandra Abts, RN [facility] Liaison [phone]

## 2020-11-30 NOTE — Plan of Care (Addendum)
Consult noted for PMT. Per conversation with primary team, goals are set and comfort care orders are in place. Per attending provider consult is no longer needed. Will D/C consult at this time.

## 2020-11-30 NOTE — Progress Notes (Signed)
PHARMACY - PHYSICIAN COMMUNICATION CRITICAL VALUE ALERT - BLOOD CULTURE IDENTIFICATION (BCID)  Harry Monroe is an 86 y.o. male who presented to [facility] on 12/17/2020 with a chief complaint of failure to thrive  Assessment:  Lab reports 1/4 bottles with staph sp  Name of physician (or Provider) ContactedRachael Fee NP  Current antibiotics: Received Cefepime, Flagyl, and Vancomycin in ED  Changes to prescribed antibiotics recommended: Pt to be admitted for end of life care per notes,  No antibiotics or IVF as per family's request Recommendations accepted by provider  No results found for this or any previous visit.  Hart Robinsons A 11/30/2020  5:43 AM

## 2020-12-01 DIAGNOSIS — C3490 Malignant neoplasm of unspecified part of unspecified bronchus or lung: Secondary | ICD-10-CM

## 2020-12-01 DIAGNOSIS — M84422S Pathological fracture, left humerus, sequela: Secondary | ICD-10-CM

## 2020-12-01 DIAGNOSIS — L89622 Pressure ulcer of left heel, stage 2: Secondary | ICD-10-CM

## 2020-12-01 DIAGNOSIS — R6520 Severe sepsis without septic shock: Secondary | ICD-10-CM

## 2020-12-01 DIAGNOSIS — L8990 Pressure ulcer of unspecified site, unspecified stage: Secondary | ICD-10-CM | POA: Insufficient documentation

## 2020-12-01 DIAGNOSIS — A419 Sepsis, unspecified organism: Secondary | ICD-10-CM

## 2020-12-01 DIAGNOSIS — I4891 Unspecified atrial fibrillation: Secondary | ICD-10-CM

## 2020-12-01 DIAGNOSIS — C7951 Secondary malignant neoplasm of bone: Secondary | ICD-10-CM

## 2020-12-01 LAB — URINE CULTURE

## 2020-12-01 NOTE — Progress Notes (Signed)
PROGRESS NOTE  Harry Monroe [facility] WFU:[id number] DOB: [birth year] DOA: 12/04/2020 PCP: Orvis Brill, [facility]  Brief History   Harry Monroe a 86 y.o.malewith medical history significant forMalignant neoplasm of the prostatewith metastasis to lymph nodes, history of hypertension, history of BPH, history of atrial fibrillation, history of ORIF of the hip left, presented to the emergency department from [facility] assisted living via EMS for chief concerns of failure to thrive symptoms including nausea, vomiting, cough, confusion, decreased p.o. intake for approximately 1 to 2 weeks.  Daughter reports that in the last week, patient has required increased assistance with activities of ADLs including getting up to use the bathroom.  Social history: lives at assisted living [place]. Heis retired. Formally worked in Architect.  ED Course:Discussed with ED provider, patient is DNR.  ED vitals were afebrile with elevated respiration rate from 28-29. EKG showed atrial fibrillation with a rate of 115, QTc 423.  Labs were remarkable for BUN of 25, calcium 8.6, alk phos is 147, troponin 98 down trended to 83, lactic acid 2.3, leukocytosis of 11.9.  After long discussion in the emergency department at [facility], family has decided to proceed with comfort care only. Palliative care has been consulted. He is comfort care only now. He is awaiting residential hospice. Comfort care order set in place. Family, daughter, Pleas Koch is adamant about no procedures and/or interventions. They also want no antibiotics or IV fluids. The patient will receive pain control with morphine as needed, would transition to a drip if needed but patient does not appear to be in pain at this time.  Consultants  . Palliative care  Procedures  . None  Antibiotics   Anti-infectives (From admission, onward)   Start     Dose/Rate Route Frequency Ordered Stop   12/07/2020 1400   ceFEPIme (MAXIPIME) 2 g in sodium chloride 0.9 % 100 mL IVPB        2 g 200 mL/hr over 30 Minutes Intravenous  Once 12/18/2020 1352 11/30/2020 1510   12/09/2020 1400  metroNIDAZOLE (FLAGYL) IVPB 500 mg        500 mg 100 mL/hr over 60 Minutes Intravenous  Once 12/13/2020 1352 12/04/2020 1637   12/22/2020 1400  vancomycin (VANCOCIN) IVPB 1000 mg/200 mL premix        1,000 mg 200 mL/hr over 60 Minutes Intravenous  Once 11/28/2020 1352 12/18/2020 1702   12/07/2020 1345  cefTRIAXone (ROCEPHIN) 1 g in sodium chloride 0.9 % 100 mL IVPB  Status:  Discontinued        1 g 200 mL/hr over 30 Minutes Intravenous  Once 11/25/2020 1331 12/14/2020 1352    .   Subjective  The patient is resting comfortably. No new complaints.  Objective   Vitals:  Vitals:   12/01/20 0749 12/01/20 1224  BP: 120/67 (!) 97/47  Pulse: 78 72  Resp: 16 20  Temp: (!) 97.4 F (36.3 C) 97.9 F (36.6 C)  SpO2: 95% 92%   Exam:  Constitutional:  . The patient is awake, alert, and oriented x 3. No acute distress. Respiratory:  . No increased work of breathing. . No wheezes, rales, or rhonchi . No tactile fremitus Cardiovascular:  . Regular rate and rhythm . No murmurs, ectopy, or gallups. . No lateral PMI. No thrills. Abdomen:  . Abdomen is soft, non-tender, non-distended . No hernias, masses, or organomegaly . Normoactive bowel sounds.  Musculoskeletal:  . No cyanosis, clubbing, or edema Skin:  . No rashes, lesions, ulcers . palpation of  skin: no induration or nodules Neurologic:  . CN 2-12 intact . Sensation all 4 extremities intact Psychiatric:  . Mental status o Mood, affect appropriate o Orientation to person, place, time  . judgment and insight appear intact I have personally reviewed the following:   Today's Data  . Vitals  Scheduled Meds: . meloxicam  15 mg Oral Daily   Active Problems:   Admission for end of life care   Pressure injury of skin   LOS: 1 day   Sepsis due to new primary pulmonary neoplasm  with multiple and distant mets.  Pathological Fractures: Due to mets from pulmonary neoplasm.  FTT  End of life care: Palliative care and hospice consulted. The patient is awaiting a bed at residential hospice. Comfort care orders in place.  The patient has been seen and examined by myself. I have spent 32 minutes in his evaluation and care.  DVT prophylaxis: None, Patient is Comfort Care Code Status: DO NOT RESUSCITATE  Family Communication: No family present at bedside  Disposition Plan: Anticipating discharging to Residential Hospice when bed is available   Status is: Inpatient  The patient will require care spanning > 2 midnights and should be moved to inpatient because: Unsafe d/c plan, IV treatments appropriate due to intensity of illness or inability to take PO and Inpatient level of care appropriate due to severity of illness  Dispo: The patient is from: Home  Anticipated d/c is to: Residential Hospice  Anticipated d/c date is:1-2 days  Patient currently is medically stable to d/c.  Loel Betancur, DO [facility] 12/01/2020, 16:03 PM

## 2020-12-01 NOTE — Progress Notes (Signed)
Nutrition Brief Note  Patient identified to be seen for Malnutrition Screening Tool (MST). Chart reviewed. Patient has transitioned to comfort care.   No nutrition interventions warranted at this time. Please consult RD as needed.   Jacklynn Barnacle, MS, RD, LDN Pager number available on Amion

## 2020-12-01 NOTE — Progress Notes (Signed)
[place] Room [facility]) Hospital Liaison RN note:  Visited with patient in room. He was resting in bed and seemed in no distress. Spoke with his daughter, Pleas Koch to update her. Unfortunately, Hospice Home does not have a bed available today to offer. Hospital care team is aware. [place] Liaison will continue to follow for room availability.   Please call with any hospice related questions or concerns.   Thank you for the opportunity to participate in this patient's care.  Zandra Abts, RN [facility] Liaison [phone]

## 2020-12-01 NOTE — Consult Note (Addendum)
[place] Nurse Consult Note: Reason for Consult: Consult requested for right heel.  Pt states this started as a blister prior to admission Wound type: Deep tissue pressure injury; 3.5X3.5cm dark reddish purple with loose peeling skin beginning to edges, no odor, drainage, or fluctuance Pressure Injury POA: Yes Dressing procedure/placement/frequency: Deep tissue pressure injuries are high risk to evolve into full thickness tissue loss.  Reduce pressure to the affected area by floating heels. Topical treatment orders provided for bedside nurses to perform as follows: Foam dressing to right heel; change Q 3 days or PRN soiling. Please re-consult if further assistance is needed.  Thank-you,  Julien Girt MSN, [place], [place], [place], [place]

## 2020-12-02 NOTE — Progress Notes (Signed)
PROGRESS NOTE  Tashawn Laswell [facility] ZOX:[id number] DOB: 09-22-1918 DOA: 11/26/2020 PCP: Orvis Brill, [facility]  Brief History   Real Cona Havereis a 86 y.o.malewith medical history significant forMalignant neoplasm of the prostatewith metastasis to lymph nodes, history of hypertension, history of BPH, history of atrial fibrillation, history of ORIF of the hip left, presented to the emergency department from [facility] assisted living via EMS for chief concerns of failure to thrive symptoms including nausea, vomiting, cough, confusion, decreased p.o. intake for approximately 1 to 2 weeks.  Daughter reports that in the last week, patient has required increased assistance with activities of ADLs including getting up to use the bathroom.  Social history: lives at assisted living [place]. Heis retired. Formally worked in Architect.  ED Course:Discussed with ED provider, patient is DNR.  ED vitals were afebrile with elevated respiration rate from 28-29. EKG showed atrial fibrillation with a rate of 115, QTc 423.  Labs were remarkable for BUN of 25, calcium 8.6, alk phos is 147, troponin 98 down trended to 83, lactic acid 2.3, leukocytosis of 11.9.  After long discussion in the emergency department at [facility], family has decided to proceed with comfort care only. Palliative care has been consulted. He is comfort care only now. He is awaiting residential hospice. Comfort care order set in place. Family, daughter, Pleas Koch is adamant about no procedures and/or interventions. They also want no antibiotics or IV fluids. The patient will receive pain control with morphine as needed, would transition to a drip if needed but patient does not appear to be in pain at this time.  The patient is awaiting a bed in residential hospice.  Consultants  . Palliative care  Procedures  . None  Antibiotics   Anti-infectives (From admission, onward)   Start      Dose/Rate Route Frequency Ordered Stop   12/12/2020 1400  ceFEPIme (MAXIPIME) 2 g in sodium chloride 0.9 % 100 mL IVPB        2 g 200 mL/hr over 30 Minutes Intravenous  Once 12/09/2020 1352 12/06/2020 1510   12/07/2020 1400  metroNIDAZOLE (FLAGYL) IVPB 500 mg        500 mg 100 mL/hr over 60 Minutes Intravenous  Once 11/28/2020 1352 12/06/2020 1637   12/01/2020 1400  vancomycin (VANCOCIN) IVPB 1000 mg/200 mL premix        1,000 mg 200 mL/hr over 60 Minutes Intravenous  Once 12/07/2020 1352 12/15/2020 1702   12/17/2020 1345  cefTRIAXone (ROCEPHIN) 1 g in sodium chloride 0.9 % 100 mL IVPB  Status:  Discontinued        1 g 200 mL/hr over 30 Minutes Intravenous  Once 12/02/2020 1331 12/15/2020 1352      Subjective  The patient is resting comfortably. No new complaints.  Objective   Vitals:  Vitals:   12/01/20 1224 12/02/20 0416  BP: (!) 97/47 123/69  Pulse: 72 (!) 133  Resp: 20 18  Temp: 97.9 F (36.6 C) 97.9 F (36.6 C)  SpO2: 92% 92%   Exam:  Constitutional:  . The patient is resting quietly. He is not awakened. No acute distress. Respiratory:  . No increased work of breathing. . No wheezes, rales, or rhonchi . No tactile fremitus Cardiovascular:  . Regular rate and rhythm . No murmurs, ectopy, or gallups. . No lateral PMI. No thrills. Abdomen:  . Abdomen is soft, non-tender, non-distended . No hernias, masses, or organomegaly . Normoactive bowel sounds.  Musculoskeletal:  . No cyanosis, clubbing, or edema Skin:  .  No rashes, lesions, ulcers . palpation of skin: no induration or nodules Neurologic:  Unable to evaluate as the patient is unable to cooperate with exam. Psychiatric:  . Unable to evaluate as the patient is unable to cooperate with exam. I have personally reviewed the following:   Today's Data  . Vitals  Scheduled Meds: . meloxicam  15 mg Oral Daily   Active Problems:   Admission for end of life care   Pressure injury of skin   LOS: 2 days   Sepsis due to new  primary pulmonary neoplasm with multiple and distant mets.  Pathological Fractures: Due to mets from pulmonary neoplasm.  FTT  End of life care: Palliative care and hospice consulted. The patient is awaiting a bed at residential hospice. Comfort care orders in place.  The patient has been seen and examined by myself. I have spent 32 minutes in his evaluation and care.  DVT prophylaxis: None, Patient is Comfort Care Code Status: DO NOT RESUSCITATE  Family Communication: No family present at bedside  Disposition Plan: Anticipating discharging to Residential Hospice when bed is available   Status is: Inpatient  The patient will require care spanning > 2 midnights and should be moved to inpatient because: Unsafe d/c plan, IV treatments appropriate due to intensity of illness or inability to take PO and Inpatient level of care appropriate due to severity of illness  Dispo: The patient is from: Home  Anticipated d/c is to: Residential Hospice  Anticipated d/c date is:1-2 days  Patient currently is medically stable to d/c.  Dyanne Yorks, DO [facility] 12/02/2020, 14:58 PM

## 2020-12-02 NOTE — Plan of Care (Signed)

## 2020-12-02 NOTE — Progress Notes (Signed)
[place] Room [place]) Hospital Liaison RN note:  Visited with patient and daughter, Pleas Koch in the room. Patient is resting and not responsive. He is having periods of apnea. Emotional support provided by RN and Deirdre Priest. Hospice Home does not have a bed to offer today. Doran Clay, TOC is aware. [place] Liaison will continue to follow for room availability.   Please call with any hospice related questions or concerns.  Thank you for the opportunity to participate in this patient's care.  Zandra Abts, RN [facility] Liaison [phone]

## 2020-12-03 DIAGNOSIS — J189 Pneumonia, unspecified organism: Secondary | ICD-10-CM

## 2020-12-03 DIAGNOSIS — J9600 Acute respiratory failure, unspecified whether with hypoxia or hypercapnia: Secondary | ICD-10-CM

## 2020-12-03 MED ORDER — POLYVINYL ALCOHOL 1.4 % OP SOLN: 1.0000 [drp] | Freq: Four times a day (QID) | OPHTHALMIC | 0 refills | Status: AC | PRN

## 2020-12-03 MED ORDER — GLYCOPYRROLATE 0.2 MG/ML IJ SOLN: 0.2000 mg | INTRAMUSCULAR | 0 refills | Status: AC | PRN

## 2020-12-03 MED ORDER — MORPHINE SULFATE (CONCENTRATE) 10 MG/0.5ML PO SOLN: 5.0000 mg | ORAL | 0 refills | Status: AC | PRN

## 2020-12-03 MED ORDER — HALOPERIDOL LACTATE 2 MG/ML PO CONC: 0.5000 mg | ORAL | 0 refills | Status: AC | PRN

## 2020-12-03 MED ORDER — BIOTENE DRY MOUTH MT LIQD: 15.0000 mL | OROMUCOSAL | 0 refills | Status: AC | PRN

## 2020-12-03 MED ORDER — LORAZEPAM 2 MG/ML PO CONC: 1.0000 mg | ORAL | 0 refills | Status: AC | PRN

## 2020-12-03 MED ORDER — ONDANSETRON 4 MG PO TBDP: 4.0000 mg | ORAL_TABLET | Freq: Four times a day (QID) | ORAL | 0 refills | Status: AC | PRN

## 2020-12-03 MED ORDER — MORPHINE SULFATE (PF) 2 MG/ML IV SOLN
1.0000 mg | INTRAVENOUS | Status: DC | PRN
  Administered 2020-12-03 (×3): 4 mg via INTRAVENOUS
  Administered 2020-12-03: 10:00:00 2 mg via INTRAVENOUS
  Filled 2020-12-03: qty 1
  Filled 2020-12-03 (×3): qty 2

## 2020-12-04 LAB — CULTURE, BLOOD (SINGLE): Special Requests: ADEQUATE

## 2020-12-25 NOTE — Discharge Summary (Addendum)
Physician Death Summary  Trentyn Boisclair [facility] JJK:[id number] DOB: 1918-11-11 DOA: 12-06-2020  PCP: Orvis Brill, [facility]  Admit date: 2020-12-06 Date and time of Death: 12/10/20 17:11  Diagnoses: Principal diagnosis is #1 1. 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 Pathological fracture of the humerus 4. Failure to thrive  Filed Weights   12-06-2020 1054  Weight: 68 kg    History of present illness: Harry Monroe is a 87 y.o. 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 intake for approximately 1 to 2 weeks.  Daughter reports that in the last week, patient has required increased assistance with activities of ADLs including getting up to use the bathroom.  Social history: lives at assisted living [place]. He is retired. Formally worked in Architect.  Hospital Course: After long discussion in the[facility]ency [facility], family has decided to proceed with comfort care only. Palliative care has been consulted. He is comfort care only now. He is awaiting residential hospice. Comfort care order set in place. Family, daughter, Pleas Koch is adamant about no procedures and/or interventions. They also want no antibiotics or IV fluids. The patient will receive pain control with morphine as needed, would transition to a drip if needed but patient does not appear to be in pain at this time.  The patient is currently comfort care. It is anticipated that residential hospice will have a bed available to this patient this  evening.  Today's assessment: S: The patient is resting comfortably. Respirations are agonal. O: Vitals:  Vitals:   12/02/20 1954 12/10/20 0718  BP: 112/62 93/61  Pulse: 73 (!) 144  Resp:  (!) 36  Temp: 98.2 F (36.8 C) 99 F (37.2 C)  SpO2: 92% (!) 89%   Exam:  Constitutional:  . The patient is sleeping soundly. He is not awakened. Respirations are agonal. No acute distress. Respiratory:  . No increased work of breathing. . No wheezes, rales, or rhonchi . No tactile fremitus Cardiovascular:  . Regular rate and rhythm . No murmurs, ectopy, or gallups. . No lateral PMI. No thrills. Abdomen:  . Abdomen is soft, non-tender, non-distended . No hernias, masses, or organomegaly . Normoactive bowel sounds.  Musculoskeletal:  . No cyanosis, clubbing, or edema Skin:  . No rashes, lesions, ulcers . palpation of skin: no induration or nodules Neurologic:  Unable to evaluate due to the patient's inability to cooperate with exam. Psychiatric:  . Unable to evaluate due to the patient's inability to cooperate with exam.  Allergies  Allergen Reactions  . Penicillins Rash    The results of significant diagnostics from this hospitalization (including imaging, microbiology, ancillary and laboratory) are listed below for reference.    Significant Diagnostic Studies: DG Chest 2 View  Result Date: 12-06-2020 CLINICAL DATA:  Shortness of breath.  Altered mental status. EXAM: CHEST - 2 VIEW COMPARISON:  None. FINDINGS: Dense irregular pleural calcifications bilaterally, left greater than right. Abnormal irregular sclerosis with some cortical indistinctness in the right proximal humerus, concerning for potential osseous metastatic disease. Otherwise bony demineralization. Atherosclerotic calcification of the aortic arch. Tortuous thoracic aorta. Midthoracic compression fracture is likely chronic. No definite blunting of the costophrenic  angles. IMPRESSION: 1. Mixed osseous sclerosis and  lucency in the right proximal humerus, highly likely to represent osseous metastatic disease. 2. Extensive pleural calcifications, left greater than right. These could obscure underlying pulmonary nodules. Consider chest CT. 3.  Aortoiliac atherosclerotic vascular disease. 4. Chronic appearing midthoracic compression fracture. Electronically Signed   By: Van Clines M.D.   On: 11/25/2020 12:27   CT CHEST ABDOMEN PELVIS W CONTRAST  Result Date: 12/01/2020 CLINICAL DATA:  Nausea vomiting, suspected bowel obstruction in a 87 year old male EXAM: CT CHEST, ABDOMEN, AND PELVIS WITH CONTRAST TECHNIQUE: Multidetector CT imaging of the chest, abdomen and pelvis was performed following the standard protocol during bolus administration of intravenous contrast. CONTRAST:  137mL OMNIPAQUE IOHEXOL 300 MG/ML  SOLN COMPARISON:  None FINDINGS: CT CHEST FINDINGS Cardiovascular: Calcified coronary artery disease. Calcified and noncalcified plaque of the thoracic aorta. No aneurysmal dilation. Central pulmonary vasculature is normal. Limited on venous phase assessment. Mediastinum/Nodes: Esophagus mildly patulous, nonspecific finding. No axillary lymphadenopathy. No mediastinal lymphadenopathy. No hilar lymphadenopathy, borderline enlarged RIGHT hilar lymph node at 1 cm. Lungs/Pleura: Extensive bilateral pleural plaques with calcification in nodularity. Pulmonary nodule with surrounding interstitial thickening in the LEFT upper lobe measuring 12 x 11 mm (image 63, series 4) no consolidation. No pleural effusion. The airways are patent. Musculoskeletal: Mixed lytic and sclerotic lesion likely associated with pathologic fracture of the RIGHT proximal humerus assessment limited due to artifact from scanning but suggested fracture line seen on CT images at the edge of the field of view on the current study. CT ABDOMEN PELVIS FINDINGS Hepatobiliary: No focal, suspicious hepatic lesion. The portal vein is patent. Sludge in the  gallbladder outlining biliary calculi. Top-normal size of the common bile duct. Pancreas: Pancreatic lesion with cystic features 1.6 cm. No main duct dilation or sign of inflammation. (Image 58, series 2) Spleen: Normal Adrenals/Urinary Tract: Normal adrenal glands. Cysts in the bilateral kidneys. No hydronephrosis. Urinary bladder is normal. Stomach/Bowel: No acute gastrointestinal process. Appendix is normal. Vascular/Lymphatic: Calcified atheromatous plaque of the abdominal aorta. No aneurysmal dilation. Atheromatous plaque extends into the pelvis into the iliac vessels. No pelvic lymphadenopathy. Retrocaval lymph node at 9 mm in the RIGHT retroperitoneum. Reproductive: Heterogeneous prostate with indistinct margins increased enhancement on the RIGHT as compared to the LEFT. Nonspecific finding on CT. Other: No free air.  No ascites. Musculoskeletal: Signs of presumed metastatic disease the RIGHT proximal humerus. Also sclerosis of RIGHT glenoid/scapula. Post ORIF of RIGHT proximal femur. Osteopenia. LEFT fifth rib laterally with subtle sclerosis, sixth rib laterally with subtle sclerosis. Anterior third and fourth ribs with sclerosis showing a subtle line, potentially representing subacute fractures of ribs described above. Sternum is intact. Throughout the spine there is multilevel loss of height, in the midthoracic spine at the T7 level there is loss of height that appears chronic due to compression fracture. Also L1 compression fracture is more likely chronic given appearance. L3 with vacuum disc phenomenon at L2-3 with 50% loss of height at L3. Minimal stranding surrounding the vertebral body at this level IMPRESSION: 1. pulmonary nodule with surrounding interstitial thickening in the LEFT upper lobe measuring 12 x 11 mm. This is concerning for primary pulmonary neoplasm in addition to metastatic disease given other findings on the current scan. 2. Signs of presumed metastatic disease the RIGHT proximal  humerus with pathologic fracture. 3. Heterogeneous prostate with indistinct margins increased enhancement on the RIGHT as compared to the LEFT. Nonspecific finding on CT. Consider correlation with PSA given sclerotic  bony metastatic disease in the RIGHT proximal humerus. 4. Proximal humeral sclerosis with heterogeneity and likely associated with pathologic fracture, partially imaged. Consider dedicated humeral evaluation. 5. Age-indeterminate compression fractures particularly at L3, (sclerosis suggests this may be subacute) correlate with any pain in the these locations as described above. There is approximately 50% loss of height at L3 with mild retropulsion of posterior cortical elements at this level. 6. Extensive pleural plaques with calcification and nodularity, correlate with any history of asbestos exposure. 7. Top-normal size the common bile duct approximately 7 mm in the setting of cholelithiasis and abdominal pain, correlate with laboratory values and clinical symptoms to determine whether further evaluation of the biliary tree may be warranted no pericholecystic stranding on the current study. 8. Pancreatic lesion with cystic features 1.6 cm. No aggressive features. Attention on follow-up for assessment with CT or MR in 2 years as clinically warranted. 9. Calcified coronary artery disease. 10. Aortic atherosclerosis. Aortic Atherosclerosis (ICD10-I70.0). Electronically Signed   By: Zetta Bills M.D.   On: 12/13/2020 13:38   US ABDOMEN LIMITED RUQ (LIVER/GB)  Result Date: 12/05/2020 CLINICAL DATA:  Intermittent nausea EXAM: ULTRASOUND ABDOMEN LIMITED RIGHT UPPER QUADRANT COMPARISON:  12/01/2020 CT chest, abdomen and pelvis. FINDINGS: Gallbladder: No wall thickening visualized. Intraluminal calculi measuring up to 5 mm. No sonographic Murphy sign noted by sonographer. Common bile duct: Diameter: 6 mm Liver: No focal lesion identified. Within normal limits in parenchymal echogenicity. Portal vein is  patent on color Doppler imaging with normal direction of blood flow towards the liver. Other: None. IMPRESSION: Cholelithiasis without cholecystitis. Electronically Signed   By: Primitivo Gauze M.D.   On: 12/15/2020 15:23    Microbiology: Recent Results (from the past 240 hour(s))  Resp Panel by RT-PCR (Flu A&B, Covid) Nasopharyngeal Swab     Status: None   Collection Time: 12/10/2020 12:12 PM   Specimen: Nasopharyngeal Swab; Nasopharyngeal(NP) swabs in vial transport medium  Result Value Ref Range Status   SARS Coronavirus 2 by RT PCR NEGATIVE NEGATIVE Final    Comment: (NOTE) SARS-CoV-2 target nucleic acids are NOT DETECTED.  The SARS-CoV-2 RNA is generally detectable in upper respiratory specimens during the acute phase of infection. The lowest concentration of SARS-CoV-2 viral copies this assay can detect is 138 copies/mL. A negative result does not preclude SARS-Cov-2 infection and should not be used as the sole basis for treatment or other patient management decisions. A negative result may occur with  improper specimen collection/handling, submission of specimen other than nasopharyngeal swab, presence of viral mutation(s) within the areas targeted by this assay, and inadequate number of viral copies(<138 copies/mL). A negative result must be combined with clinical observations, patient history, and epidemiological information. The expected result is Negative.  Fact Sheet for Patients:  EntrepreneurPulse.com.au  Fact Sheet for Healthcare Providers:  IncredibleEmployment.be  This test is no t yet approved or cleared by the [place] FDA and  has been authorized for detection and/or diagnosis of SARS-CoV-2 by FDA under an Emergency Use Authorization (EUA). This EUA will remain  in effect (meaning this test can be used) for the duration of the COVID-19 declaration under Section 564(b)(1) of the Act, 21 U.S.C.section 360bbb-3(b)(1),  unless the authorization is terminated  or revoked sooner.       Influenza A by PCR NEGATIVE NEGATIVE Final   Influenza B by PCR NEGATIVE NEGATIVE Final    Comment: (NOTE) The Xpert Xpress SARS-CoV-2/FLU/RSV plus assay is intended as an aid in the diagnosis of influenza from  Nasopharyngeal swab specimens and should not be used as a sole basis for treatment. Nasal washings and aspirates are unacceptable for Xpert Xpress SARS-CoV-2/FLU/RSV testing.  Fact Sheet for Patients: EntrepreneurPulse.com.au  Fact Sheet for Healthcare Providers: IncredibleEmployment.be  This test is not yet approved or cleared by the [place] FDA and has been authorized for detection and/or diagnosis of SARS-CoV-2 by FDA under an Emergency Use Authorization (EUA). This EUA will remain in effect (meaning this test can be used) for the duration of the COVID-19 declaration under Section 564(b)(1) of the Act, 21 U.S.C. section 360bbb-3(b)(1), unless the authorization is terminated or revoked.  Performed at [facility], [address] [place], [place] [address]   Blood culture (routine single)     Status: Abnormal (Preliminary result)   Collection Time: 12/07/2020 12:13 PM   Specimen: BLOOD  Result Value Ref Range Status   Specimen Description   Final    BLOOD BLOOD LEFT FOREARM Performed at [facility], [address], [place], [place] [address]    Special Requests   Final    BOTTLES DRAWN AEROBIC AND ANAEROBIC Blood Culture adequate volume Performed at [facility], [address] [place], [place] [address]    Culture  Setup Time   Final    GRAM POSITIVE COCCI IN BOTH AEROBIC AND ANAEROBIC BOTTLES CRITICAL RESULT CALLED TO, READ BACK BY AND VERIFIED WITH: SCOTT HALL@0505  11/30/20 RH    Culture (A)  Final    STAPHYLOCOCCUS AUREUS CULTURE REINCUBATED FOR BETTER GROWTH TO BE SENT TO [facility] FOR SUSCEPTIBILITY Performed at [facility], [place] [address] [place], [place] [address]    Report Status PENDING  Incomplete  Blood Culture ID Panel (Reflexed)     Status: Abnormal   Collection Time: 12/07/2020 12:13 PM  Result Value Ref Range Status   Enterococcus faecalis NOT DETECTED NOT DETECTED Final   Enterococcus Faecium NOT DETECTED NOT DETECTED Final   Listeria monocytogenes NOT DETECTED NOT DETECTED Final   Staphylococcus species DETECTED (A) NOT DETECTED Final    Comment: CRITICAL RESULT CALLED TO, READ BACK BY AND VERIFIED WITH: SCOTT HALL @0505  11/30/20 RH    Staphylococcus aureus (BCID) DETECTED (A) NOT DETECTED Final    Comment: Methicillin (oxacillin)-resistant Staphylococcus aureus (MRSA). MRSA is predictably resistant to beta-lactam antibiotics (except ceftaroline). Preferred therapy is vancomycin unless clinically contraindicated. Patient requires contact precautions if  hospitalized. CRITICAL RESULT CALLED TO, READ BACK BY AND VERIFIED WITH: SCOTT HALL @0505  11/30/20 RH    Staphylococcus epidermidis NOT DETECTED NOT DETECTED Final   Staphylococcus lugdunensis NOT DETECTED NOT DETECTED Final   Streptococcus species NOT DETECTED NOT DETECTED Final   Streptococcus agalactiae NOT DETECTED NOT DETECTED Final   Streptococcus pneumoniae NOT DETECTED NOT DETECTED Final   Streptococcus pyogenes NOT DETECTED NOT DETECTED Final   A.calcoaceticus-baumannii NOT DETECTED NOT DETECTED Final   Bacteroides fragilis NOT DETECTED NOT DETECTED Final   Enterobacterales NOT DETECTED NOT DETECTED Final   Enterobacter cloacae complex NOT DETECTED NOT DETECTED Final   Escherichia coli NOT DETECTED NOT DETECTED Final   Klebsiella aerogenes NOT DETECTED NOT DETECTED Final   Klebsiella oxytoca NOT DETECTED NOT DETECTED Final   Klebsiella pneumoniae NOT DETECTED NOT DETECTED Final   Proteus species NOT DETECTED NOT DETECTED Final   Salmonella species NOT DETECTED NOT DETECTED Final   Serratia marcescens NOT DETECTED NOT  DETECTED Final   Haemophilus influenzae NOT DETECTED NOT DETECTED Final   Neisseria meningitidis NOT DETECTED NOT DETECTED Final  Pseudomonas aeruginosa NOT DETECTED NOT DETECTED Final   Stenotrophomonas maltophilia NOT DETECTED NOT DETECTED Final   Candida albicans NOT DETECTED NOT DETECTED Final   Candida auris NOT DETECTED NOT DETECTED Final   Candida glabrata NOT DETECTED NOT DETECTED Final   Candida krusei NOT DETECTED NOT DETECTED Final   Candida parapsilosis NOT DETECTED NOT DETECTED Final   Candida tropicalis NOT DETECTED NOT DETECTED Final   Cryptococcus neoformans/gattii NOT DETECTED NOT DETECTED Final   Meth resistant mecA/C and MREJ DETECTED (A) NOT DETECTED Final    Comment: CRITICAL RESULT CALLED TO, READ BACK BY AND VERIFIED WITH: SCOTT HALL @0505  11/30/20 RH Performed at [facility], [address], [place], [place] [address]   Urine culture     Status: Abnormal   Collection Time: 12/19/2020 12:44 PM   Specimen: In/Out Cath Urine  Result Value Ref Range Status   Specimen Description   Final    IN/OUT CATH URINE Performed at [facility], [address] [place], [place] [address]    Special Requests   Final    NONE Performed at [facility], [address] [place], [place] [address]    Culture MULTIPLE SPECIES PRESENT, SUGGEST RECOLLECTION (A)  Final   Report Status 12/01/2020 FINAL  Final     Labs: Basic Metabolic Panel: Recent Labs  Lab 12/23/2020 1106  NA 137  K 3.5  CL 101  CO2 23  GLUCOSE 127*  BUN 25*  CREATININE 0.84  CALCIUM 8.6*   Liver Function Tests: Recent Labs  Lab 12/17/2020 1106  AST 26  ALT 18  ALKPHOS 147*  BILITOT 1.7*  PROT 5.9*  ALBUMIN 3.6   No results for input(s): LIPASE, AMYLASE in the last 168 hours. No results for input(s): AMMONIA in the last 168 hours. CBC: Recent Labs  Lab 11/28/2020 1106  WBC 11.9*  HGB 14.0  HCT 41.3  MCV 93.4  PLT 177   Cardiac Enzymes: No results for  input(s): CKTOTAL, CKMB, CKMBINDEX, TROPONINI in the last 168 hours. BNP: BNP (last 3 results) No results for input(s): BNP in the last 8760 hours.  ProBNP (last 3 results) No results for input(s): PROBNP in the last 8760 hours.  CBG: No results for input(s): GLUCAP in the last 168 hours.  Active Problems:   Admission for end of life care   Pressure injury of skin   Time coordinating discharge: 38 minutes.  Signed:        Jezlyn Westerfield, DO [facility]  01/02/21, 5:34 PM

## 2020-12-25 NOTE — Progress Notes (Signed)
Patient taken to morgue. Funeral home information written on form per [place].

## 2020-12-25 NOTE — Progress Notes (Signed)
Patient noted to have no audible heartbeat or palpable pulse. No spontaneous respirations. Verified by RN x2. Hospice and MD made aware. Daughter notified.

## 2020-12-25 NOTE — Care Management Important Message (Signed)
Important Message  Patient Details  Name: Harry Monroe MRN: [id number] Date of Birth: 10-03-1918   Medicare Important Message Given:  Other (see comment)  Patient on [place] and out of respect for the patient and family no Important Message from [facility] given.   Juliann Pulse A Lashya Passe 25-Dec-2020, 8:30 AM

## 2020-12-25 NOTE — Progress Notes (Signed)
[facility]) Hospital Liaison RN note:  [facility] does have a bed to offer today. Spoke with daughter, Pleas Koch in the room and she will be completing the registration paperwork today at 1:30 at the [facility]. Patient will need non-emergent transport for a 9pm pickup.  Night RN to arrange transportation. [place] Liaison will fax D/C Summary to [place] when available.  Thank you for the opportunity to participate in this patient's care.  Loney Laurence [facility] Liaison [phone]

## 2020-12-25 NOTE — TOC Transition Note (Signed)
Transition of Care [facility]) - CM/SW Discharge Note   Patient Details  Name: Harry Monroe MRN: [id number] Date of Birth: 1918/03/08  Transition of Care [facility]) CM/SW Contact:  Shelbie Hutching, RN Phone Number: 01/01/2021, 2:35 PM   Clinical Narrative:    Patient will discharge to the [facility] hospice home in [place].  They cannot accept the patient until after 9pm.  Discharge packet has been completed.  Bedside RN will need to arrange EMS transport this evening.   Final next level of care: [place] Barriers to Discharge: Barriers Resolved   Patient Goals and CMS Choice Patient states their goals for this hospitalization and ongoing recovery are:: Patient and family just want him to be comfortable CMS Medicare.gov Compare Post Acute Care list provided to:: Patient Represenative (must comment) Choice offered to / list presented to : [place] / [place]  Discharge Placement              Patient chooses bed at:  [facility]) Patient to be transferred to facility by: [facility] Name of family member notified: Pleas Koch Patient and family notified of of transfer: 2021-01-01  Discharge Plan and Services In-house Referral: Hospice / Palliative Care Discharge Planning Services: CM Consult Post Acute Care Choice: Hospice          DME Arranged: N/A DME Agency: NA       HH Arranged: NA          Social Determinants of Health (SDOH) Interventions     Readmission Risk Interventions No flowsheet data found.

## 2020-12-25 DEATH — deceased

## 2021-08-11 IMAGING — US US ABDOMEN LIMITED
1 series · 14 of 25 positions shown · non-contrast
Comparison: 11/29/2020 CT chest, abdomen and pelvis.

CLINICAL DATA: Intermittent nausea

EXAM:
ULTRASOUND ABDOMEN LIMITED RIGHT UPPER QUADRANT

[Series 1: us abdomen limited · 0.24mm/px · 14 of 37 slices shown]
[im 1/37]
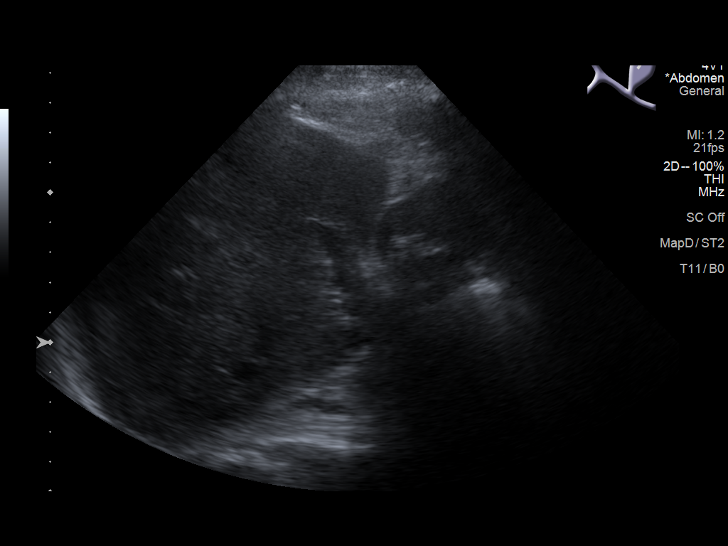
[im 4/37]
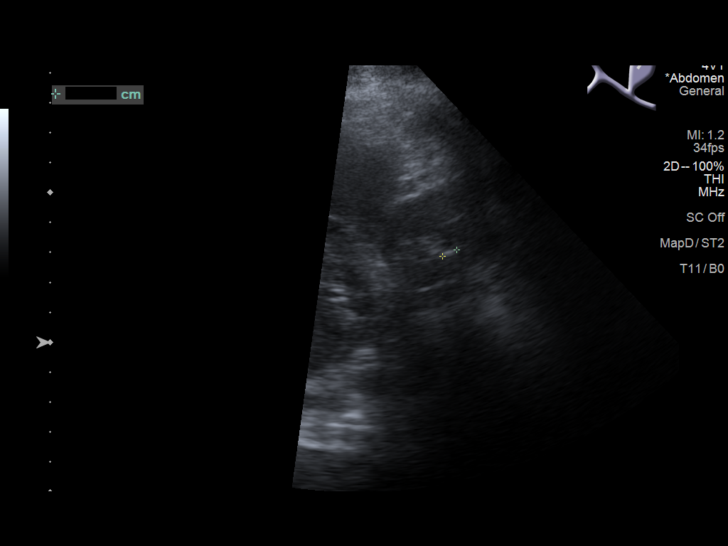
[im 7/37]
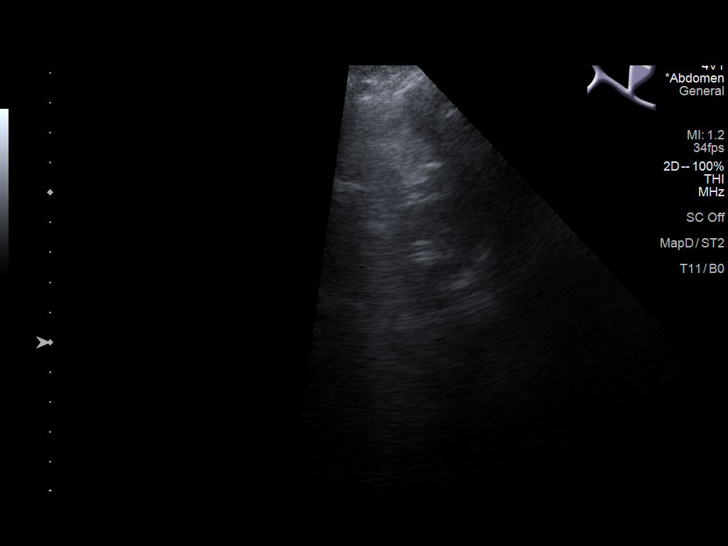
[im 10/37]
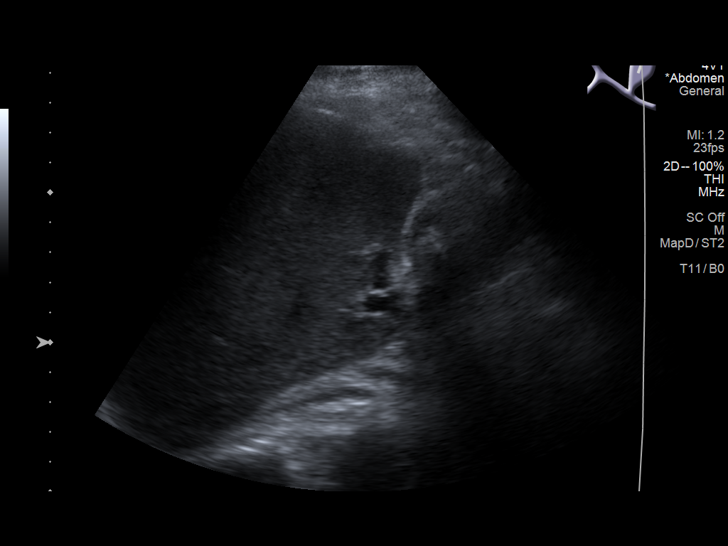
[im 13/37]
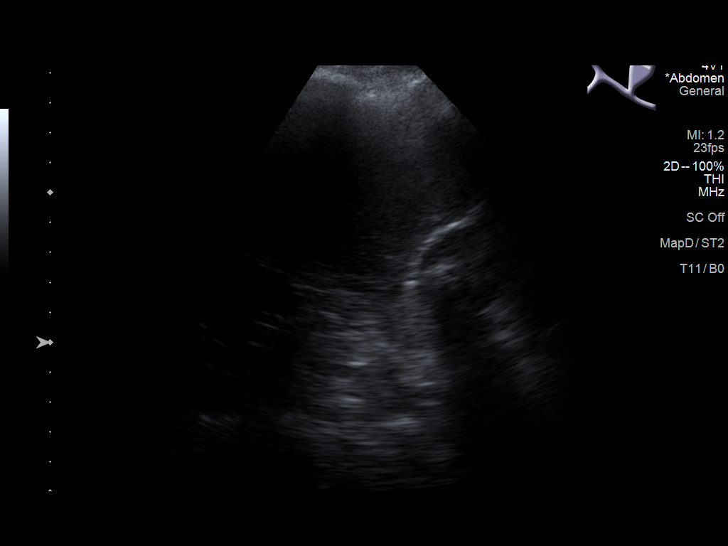
[im 14/37]
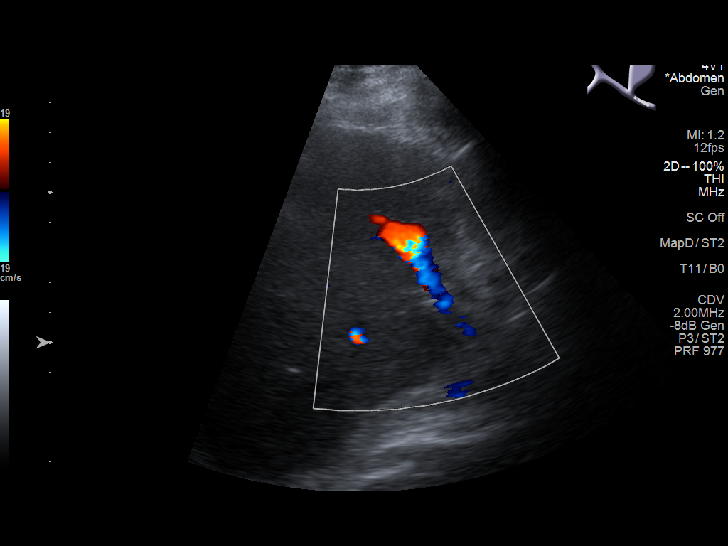
[im 17/37]
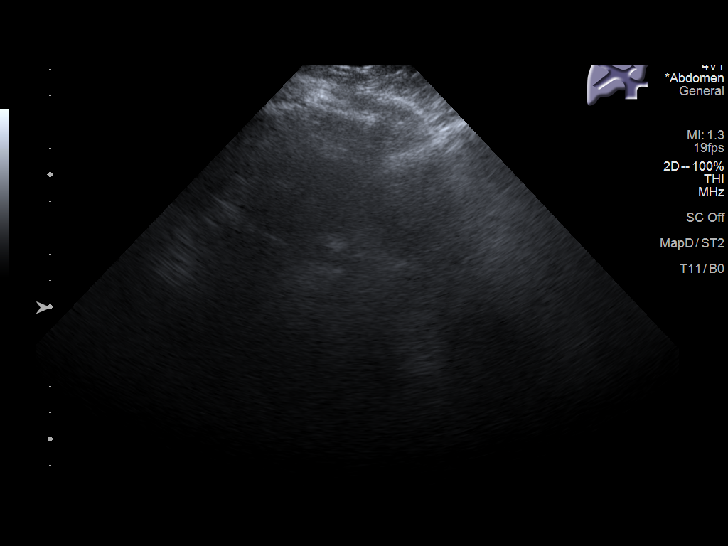
[im 20/37]
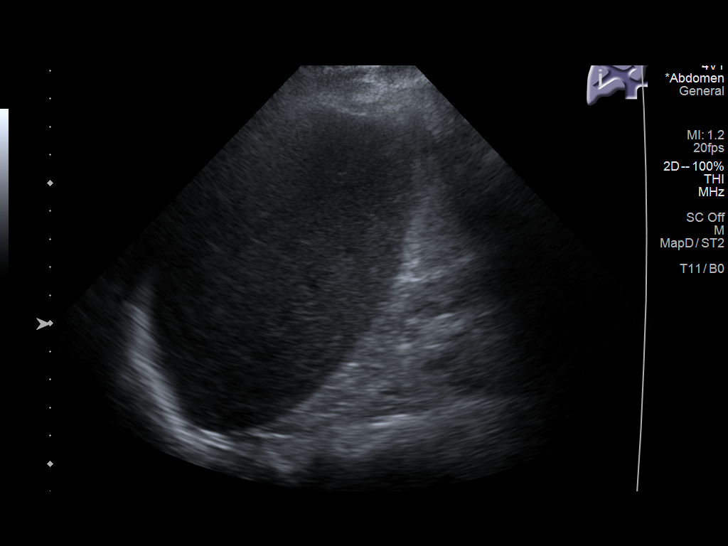
[im 23/37]
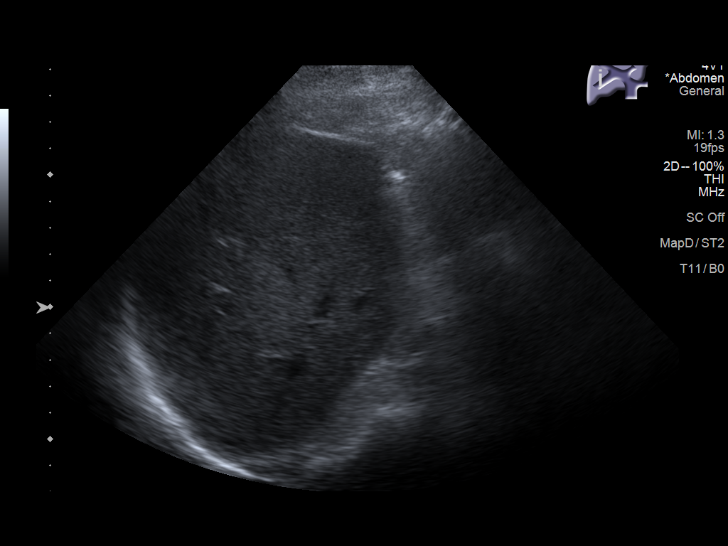
[im 25/37]
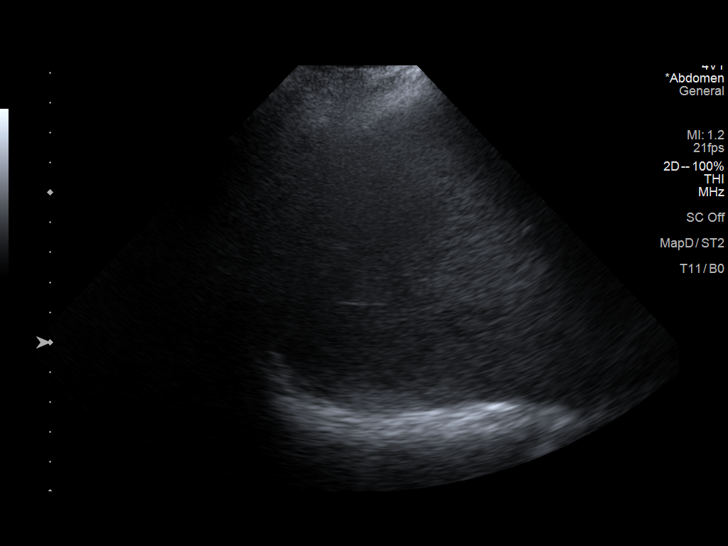
[im 28/37]
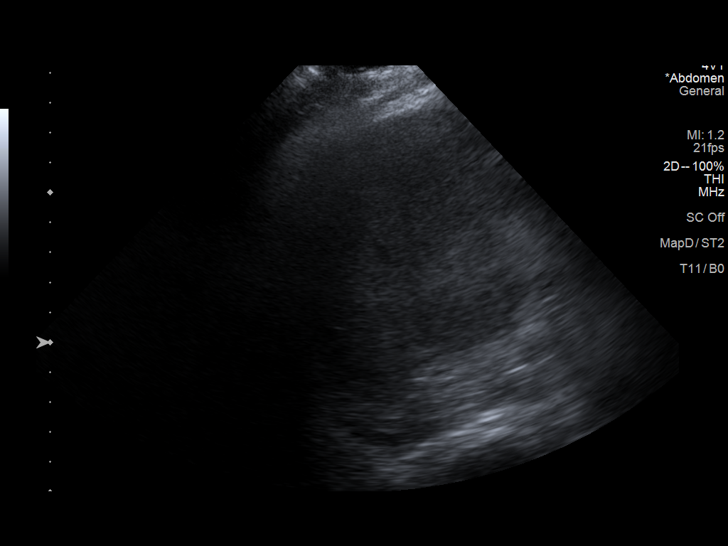
[im 31/37]
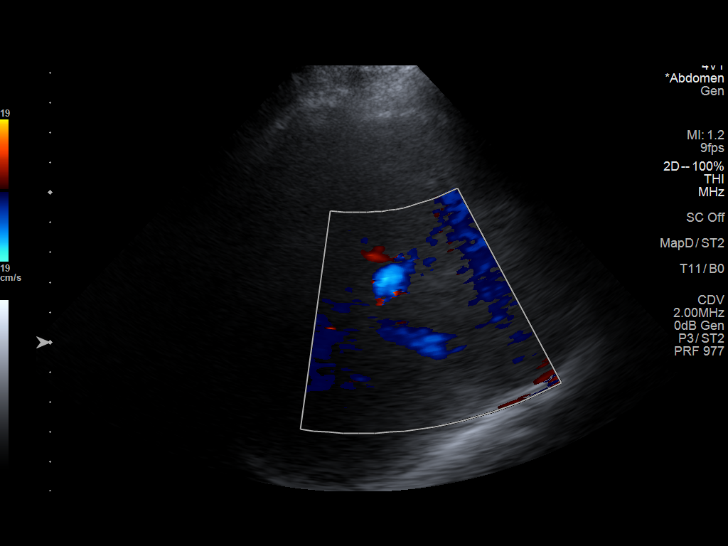
[im 34/37]
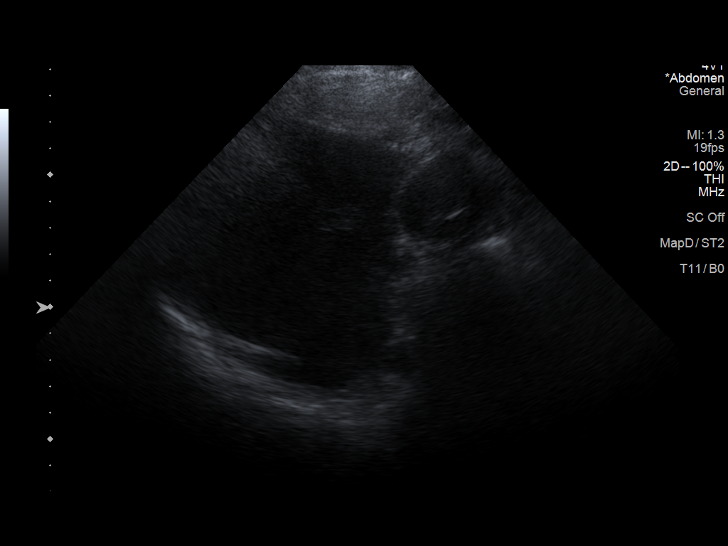
[im 37/37]
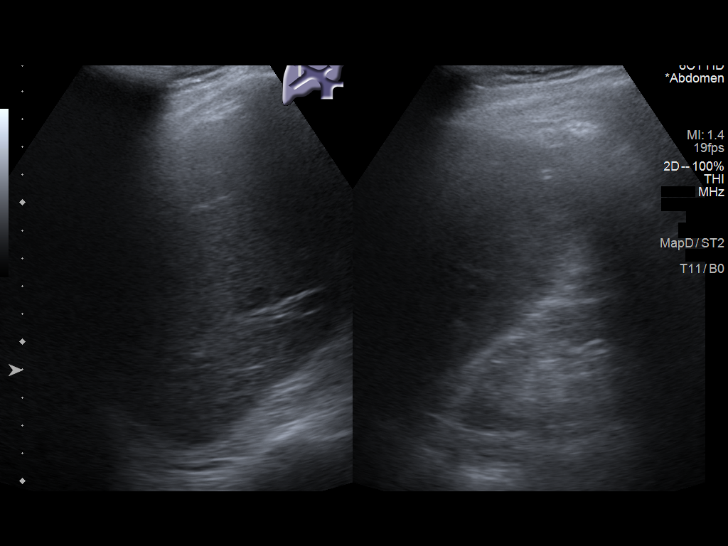

[14 of 25 positions shown; findings below may reference images not displayed]

FINDINGS: Gallbladder:

No wall thickening visualized. Intraluminal calculi measuring up to
5 mm. No sonographic Murphy sign noted by sonographer.

Common bile duct:

Diameter: 6 mm

Liver:

No focal lesion identified. Within normal limits in parenchymal
echogenicity. Portal vein is patent on color Doppler imaging with
normal direction of blood flow towards the liver.

Other: None.
IMPRESSION: Cholelithiasis without cholecystitis.

## 2021-08-11 IMAGING — CR DG CHEST 2V
2 series · 3 of 3 positions shown · non-contrast
Comparison: None.

CLINICAL DATA: Shortness of breath.  Altered mental status.

EXAM:
CHEST - 2 VIEW

[Series 2: chest lat · 0.14mm/px · 2 of 2 slices shown]
[im 1/2]
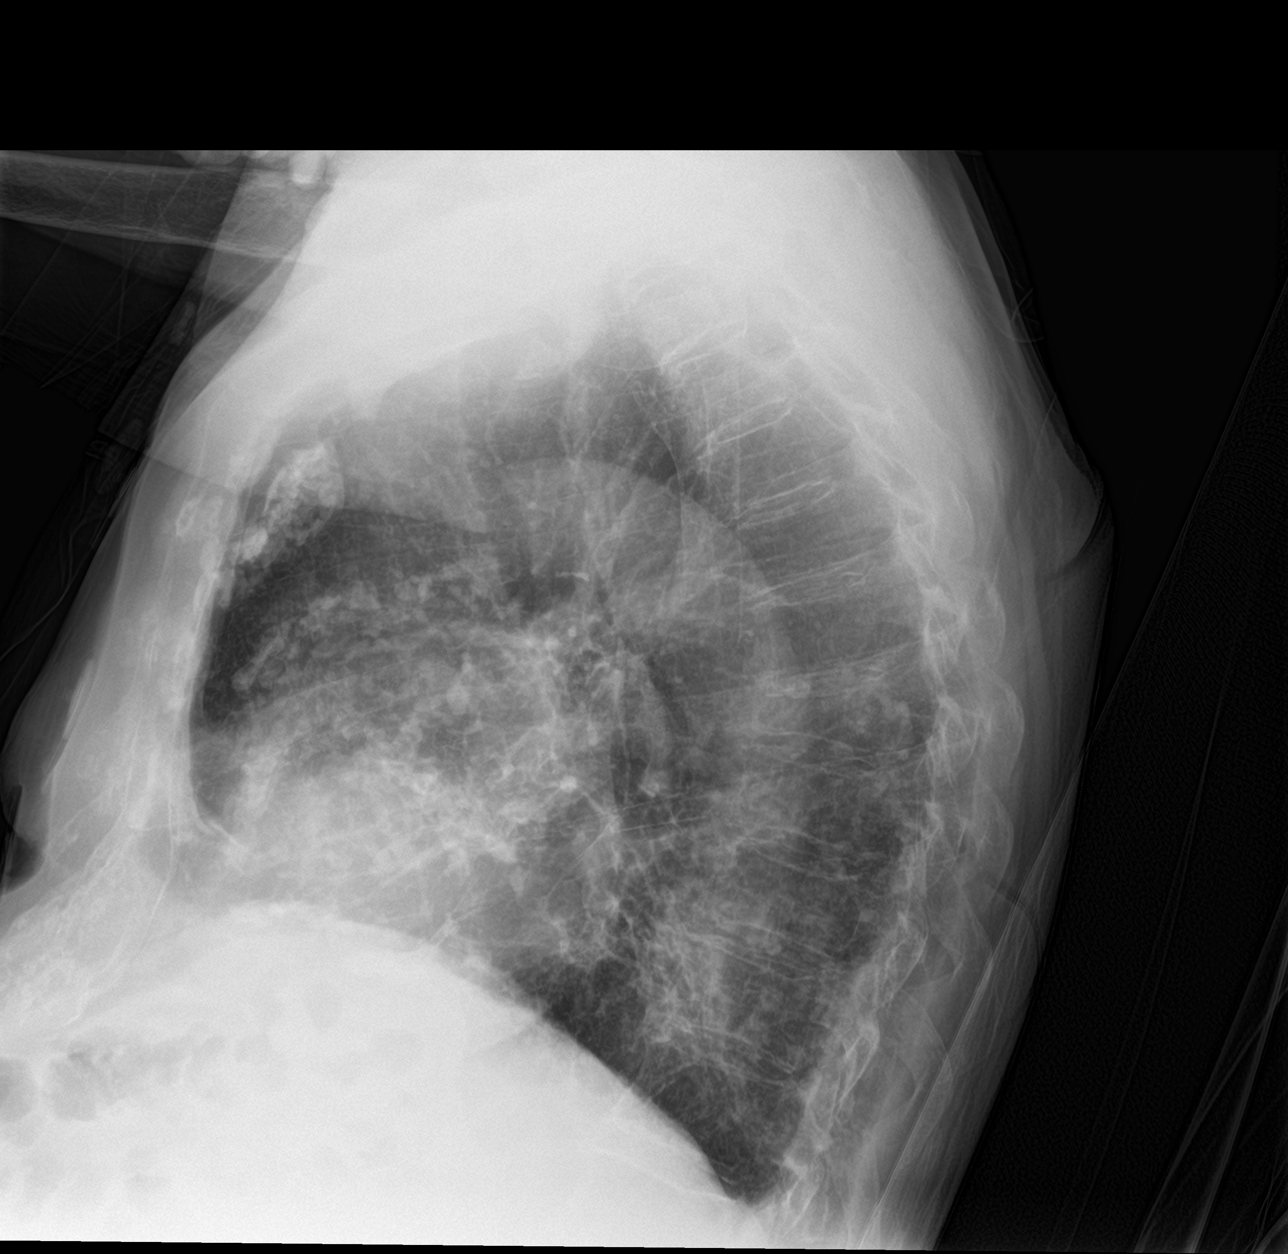
[im 2/2]
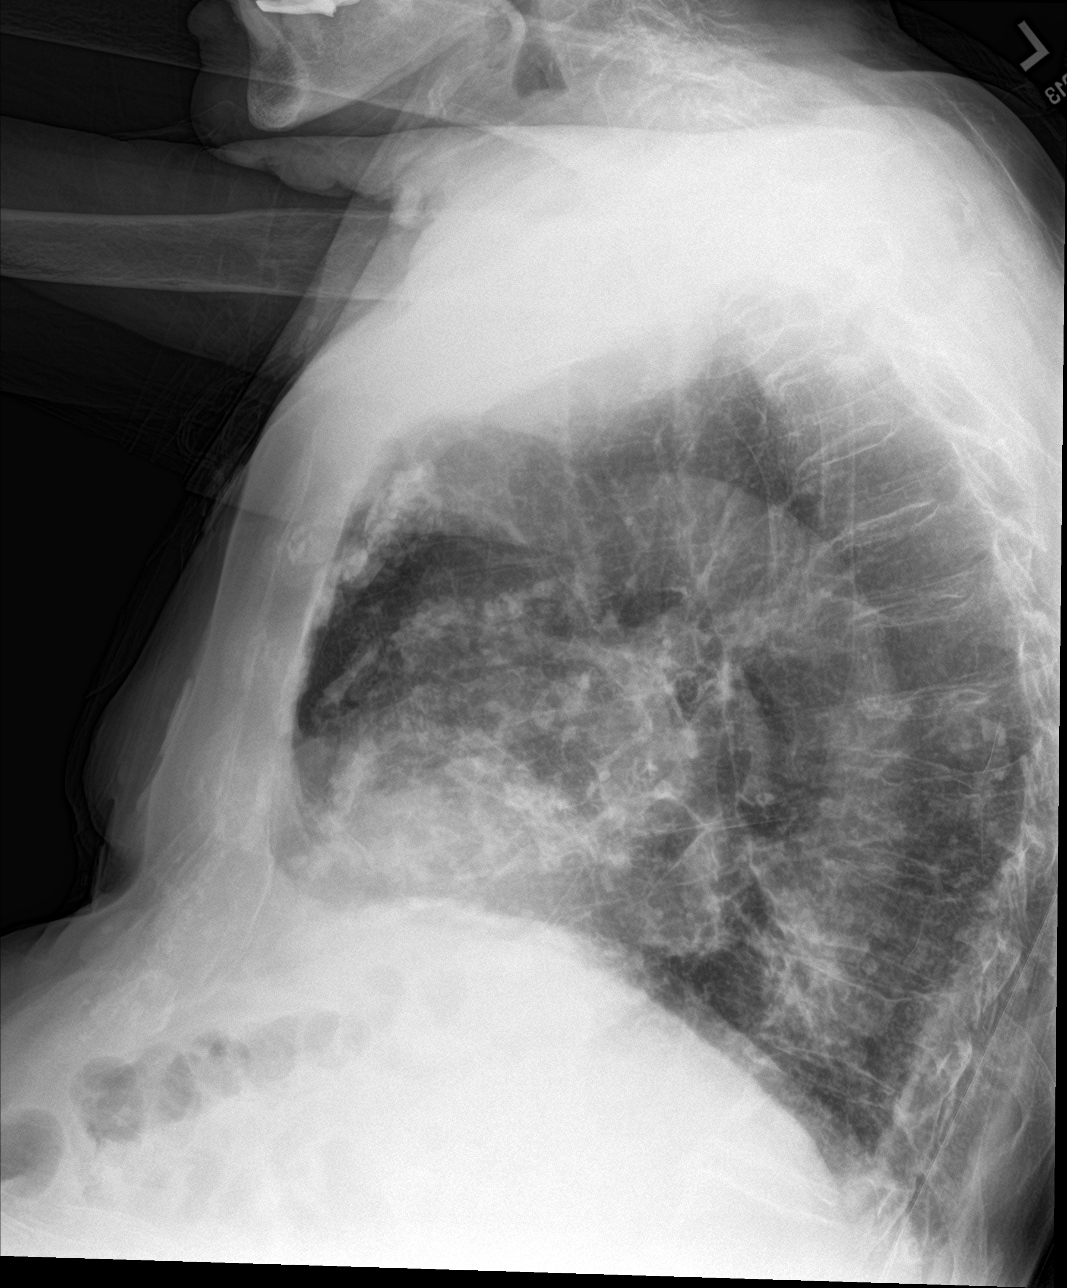

[chest ap]
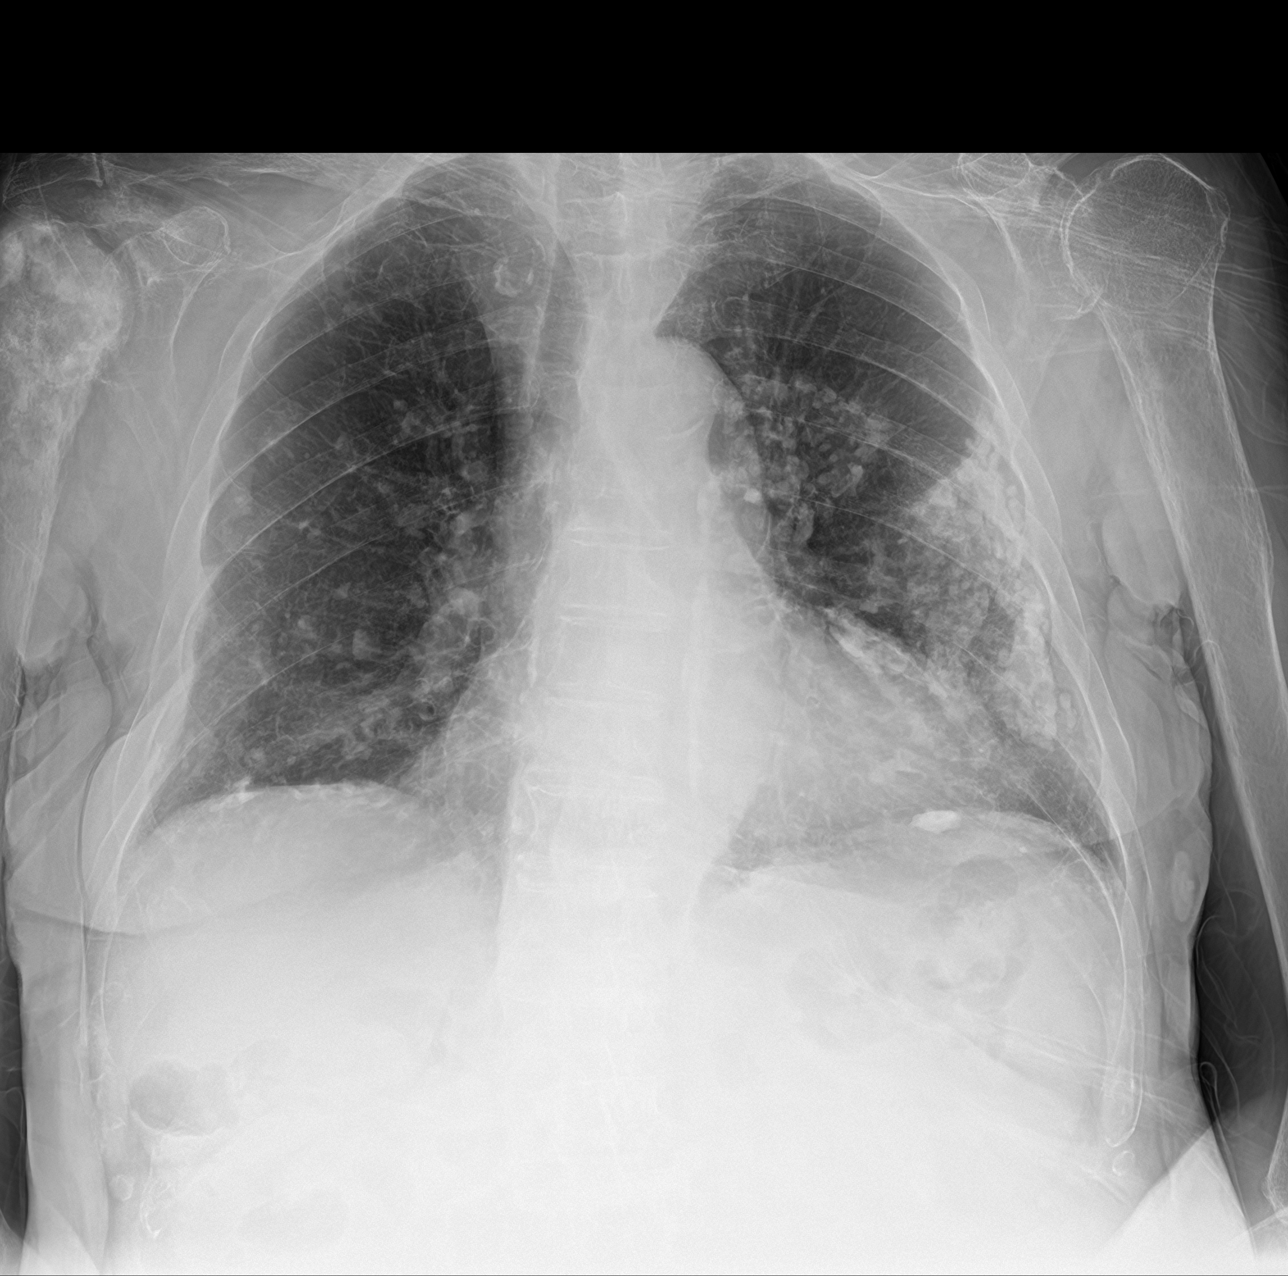

[3 of 3 positions shown; findings below may reference images not displayed]

FINDINGS: Dense irregular pleural calcifications bilaterally, left greater
than right.

Abnormal irregular sclerosis with some cortical indistinctness in
the right proximal humerus, concerning for potential osseous
metastatic disease.

Otherwise bony demineralization. Atherosclerotic calcification of
the aortic arch. Tortuous thoracic aorta.

Midthoracic compression fracture is likely chronic. No definite
blunting of the costophrenic angles.
IMPRESSION: 1. Mixed osseous sclerosis and lucency in the right proximal
humerus, highly likely to represent osseous metastatic disease.
2. Extensive pleural calcifications, left greater than right. These
could obscure underlying pulmonary nodules. Consider chest CT.
3.  Aortoiliac atherosclerotic vascular disease.
4. Chronic appearing midthoracic compression fracture.
